# Patient Record
Sex: Female | Born: 1960 | Race: White | Hispanic: No | Marital: Married | Smoking: Former smoker
Health system: Southern US, Community
[De-identification: ages and names within clinical notes are randomized; demographics above are authoritative.]

## PROBLEM LIST (undated history)

## (undated) DIAGNOSIS — N393 Stress incontinence (female) (male): Secondary | ICD-10-CM

## (undated) DIAGNOSIS — G709 Myoneural disorder, unspecified: Secondary | ICD-10-CM

## (undated) DIAGNOSIS — F419 Anxiety disorder, unspecified: Secondary | ICD-10-CM

## (undated) DIAGNOSIS — F32A Depression, unspecified: Secondary | ICD-10-CM

## (undated) DIAGNOSIS — Z973 Presence of spectacles and contact lenses: Secondary | ICD-10-CM

## (undated) DIAGNOSIS — F329 Major depressive disorder, single episode, unspecified: Secondary | ICD-10-CM

## (undated) DIAGNOSIS — N201 Calculus of ureter: Secondary | ICD-10-CM

## (undated) DIAGNOSIS — R51 Headache: Secondary | ICD-10-CM

## (undated) DIAGNOSIS — R519 Headache, unspecified: Secondary | ICD-10-CM

## (undated) DIAGNOSIS — Z87442 Personal history of urinary calculi: Secondary | ICD-10-CM

## (undated) HISTORY — PX: TONSILLECTOMY: SUR1361

## (undated) HISTORY — DX: Anxiety disorder, unspecified: F41.9

## (undated) HISTORY — DX: Depression, unspecified: F32.A

## (undated) HISTORY — DX: Major depressive disorder, single episode, unspecified: F32.9

---
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
## 1999-02-23 ENCOUNTER — Other Ambulatory Visit: Admission: RE | Admit: 1999-02-23 | Discharge: 1999-02-23 | Payer: Self-pay | Admitting: *Deleted

## 1999-08-15 HISTORY — PX: CYSTOSCOPY W/ URETERAL STENT PLACEMENT: SHX1429

## 2000-02-29 ENCOUNTER — Other Ambulatory Visit: Admission: RE | Admit: 2000-02-29 | Discharge: 2000-02-29 | Payer: Self-pay | Admitting: Obstetrics and Gynecology

## 2001-03-01 ENCOUNTER — Other Ambulatory Visit: Admission: RE | Admit: 2001-03-01 | Discharge: 2001-03-01 | Payer: Self-pay | Admitting: Obstetrics and Gynecology

## 2001-06-12 ENCOUNTER — Ambulatory Visit (HOSPITAL_COMMUNITY): Admission: RE | Admit: 2001-06-12 | Discharge: 2001-06-12 | Payer: Self-pay | Admitting: Family Medicine

## 2001-06-12 ENCOUNTER — Encounter: Payer: Self-pay | Admitting: Family Medicine

## 2001-10-04 ENCOUNTER — Ambulatory Visit (HOSPITAL_COMMUNITY): Admission: RE | Admit: 2001-10-04 | Discharge: 2001-10-04 | Payer: Self-pay | Admitting: Urology

## 2001-10-04 ENCOUNTER — Encounter: Payer: Self-pay | Admitting: Urology

## 2002-03-14 ENCOUNTER — Other Ambulatory Visit: Admission: RE | Admit: 2002-03-14 | Discharge: 2002-03-14 | Payer: Self-pay | Admitting: Obstetrics and Gynecology

## 2003-02-08 ENCOUNTER — Emergency Department (HOSPITAL_COMMUNITY): Admission: EM | Admit: 2003-02-08 | Discharge: 2003-02-08 | Payer: Self-pay | Admitting: Emergency Medicine

## 2003-03-12 ENCOUNTER — Ambulatory Visit (HOSPITAL_COMMUNITY): Admission: RE | Admit: 2003-03-12 | Discharge: 2003-03-12 | Payer: Self-pay | Admitting: Pulmonary Disease

## 2003-03-18 ENCOUNTER — Ambulatory Visit (HOSPITAL_COMMUNITY): Admission: RE | Admit: 2003-03-18 | Discharge: 2003-03-18 | Payer: Self-pay | Admitting: Pulmonary Disease

## 2003-03-23 ENCOUNTER — Other Ambulatory Visit: Admission: RE | Admit: 2003-03-23 | Discharge: 2003-03-23 | Payer: Self-pay | Admitting: Obstetrics and Gynecology

## 2007-01-31 ENCOUNTER — Ambulatory Visit (HOSPITAL_COMMUNITY): Admission: RE | Admit: 2007-01-31 | Discharge: 2007-01-31 | Payer: Self-pay | Admitting: Pulmonary Disease

## 2008-01-27 ENCOUNTER — Ambulatory Visit (HOSPITAL_COMMUNITY): Admission: RE | Admit: 2008-01-27 | Discharge: 2008-01-27 | Payer: Self-pay | Admitting: Pulmonary Disease

## 2008-02-13 ENCOUNTER — Ambulatory Visit (HOSPITAL_COMMUNITY): Admission: RE | Admit: 2008-02-13 | Discharge: 2008-02-13 | Payer: Self-pay | Admitting: Pulmonary Disease

## 2008-04-16 ENCOUNTER — Ambulatory Visit (HOSPITAL_COMMUNITY): Admission: RE | Admit: 2008-04-16 | Discharge: 2008-04-16 | Payer: Self-pay | Admitting: Pulmonary Disease

## 2008-10-18 ENCOUNTER — Ambulatory Visit (HOSPITAL_COMMUNITY): Admission: RE | Admit: 2008-10-18 | Discharge: 2008-10-18 | Payer: Self-pay | Admitting: Pulmonary Disease

## 2009-10-25 ENCOUNTER — Ambulatory Visit (HOSPITAL_COMMUNITY): Admission: RE | Admit: 2009-10-25 | Discharge: 2009-10-26 | Payer: Self-pay | Admitting: Orthopedic Surgery

## 2009-10-25 HISTORY — PX: PERCUTANEOUS PINNING FEMORAL NECK FRACTURE: SUR1014

## 2010-11-07 LAB — URINE MICROSCOPIC-ADD ON

## 2010-11-07 LAB — BASIC METABOLIC PANEL
BUN: 11 mg/dL (ref 6–23)
CO2: 23 mEq/L (ref 19–32)
Calcium: 9.2 mg/dL (ref 8.4–10.5)
Chloride: 103 mEq/L (ref 96–112)
Creatinine, Ser: 0.75 mg/dL (ref 0.4–1.2)
GFR calc Af Amer: >60 mL/min (ref >60)
GFR calc non Af Amer: >60 mL/min (ref >60)
Glucose, Bld: 173 mg/dL — ABNORMAL HIGH (ref 70–99)
Potassium: 4.3 mEq/L (ref 3.5–5.1)
Sodium: 135 mEq/L (ref 135–145)

## 2010-11-07 LAB — COMPREHENSIVE METABOLIC PANEL
ALT: 20 U/L (ref 0–35)
AST: 20 U/L (ref 0–37)
Albumin: 3.9 g/dL (ref 3.5–5.2)
Alkaline Phosphatase: 51 U/L (ref 39–117)
BUN: 13 mg/dL (ref 6–23)
CO2: 27 mEq/L (ref 19–32)
Calcium: 9.4 mg/dL (ref 8.4–10.5)
Chloride: 105 mEq/L (ref 96–112)
Creatinine, Ser: 0.76 mg/dL (ref 0.4–1.2)
GFR calc Af Amer: >60 mL/min (ref >60)
GFR calc non Af Amer: >60 mL/min (ref >60)
Glucose, Bld: 92 mg/dL (ref 70–99)
Potassium: 4 mEq/L (ref 3.5–5.1)
Sodium: 138 mEq/L (ref 135–145)
Total Bilirubin: 0.4 mg/dL (ref 0.3–1.2)
Total Protein: 7.2 g/dL (ref 6.0–8.3)

## 2010-11-07 LAB — URINALYSIS, ROUTINE W REFLEX MICROSCOPIC
Bilirubin Urine: NEGATIVE
Glucose, UA: NEGATIVE mg/dL
Ketones, ur: NEGATIVE mg/dL
Leukocytes, UA: NEGATIVE
Nitrite: NEGATIVE
Protein, ur: NEGATIVE mg/dL
Specific Gravity, Urine: 1.027 (ref 1.005–1.030)
Urobilinogen, UA: 0.2 mg/dL (ref 0.0–1.0)
pH: 5.5 (ref 5.0–8.0)

## 2010-11-07 LAB — CBC
HCT: 43.1 % (ref 36.0–46.0)
HCT: 43.9 % (ref 36.0–46.0)
Hemoglobin: 14.2 g/dL (ref 12.0–15.0)
Hemoglobin: 14.6 g/dL (ref 12.0–15.0)
MCHC: 33 g/dL (ref 30.0–36.0)
MCHC: 33.1 g/dL (ref 30.0–36.0)
MCV: 91.9 fL (ref 78.0–100.0)
MCV: 93.8 fL (ref 78.0–100.0)
Platelets: 303 10*3/uL (ref 150–400)
Platelets: 313 10*3/uL (ref 150–400)
RBC: 4.59 MIL/uL (ref 3.87–5.11)
RBC: 4.78 MIL/uL (ref 3.87–5.11)
RDW: 13.1 % (ref 11.5–15.5)
RDW: 13.5 % (ref 11.5–15.5)
WBC: 14.3 10*3/uL — ABNORMAL HIGH (ref 4.0–10.5)
WBC: 9.3 10*3/uL (ref 4.0–10.5)

## 2010-11-07 LAB — PROTIME-INR
INR: 0.97 (ref 0.00–1.49)
Prothrombin Time: 12.8 seconds (ref 11.6–15.2)

## 2010-11-07 LAB — APTT: aPTT: 28 seconds (ref 24–37)

## 2010-11-07 LAB — PREGNANCY, URINE: Preg Test, Ur: NEGATIVE

## 2010-12-30 NOTE — Procedures (Signed)
   NAME:  Katherine Dodson, Katherine Dodson                            ACCOUNT NO.:  [id number]   MEDICAL RECORD NO.:  [id number]                   PATIENT TYPE:  OUT   LOCATION:  RESP                                 FACILITY:  [facility]   PHYSICIAN:  Edward L. Juanetta Gosling, M.D.             DATE OF BIRTH:  [birth year]   DATE OF PROCEDURE:  DATE OF DISCHARGE:                              PULMONARY FUNCTION TEST   IMPRESSION:  Spirometry is normal.                                               Edward L. Juanetta Gosling, M.D.    [facility]MED[facility]  D:  03/18/2003  T:  03/18/2003  Job:  [id number]

## 2013-02-27 ENCOUNTER — Other Ambulatory Visit (HOSPITAL_COMMUNITY): Payer: Self-pay | Admitting: Pulmonary Disease

## 2013-02-27 DIAGNOSIS — M545 Low back pain, unspecified: Secondary | ICD-10-CM

## 2013-03-05 ENCOUNTER — Ambulatory Visit (HOSPITAL_COMMUNITY): Payer: BC Managed Care – PPO

## 2013-06-02 ENCOUNTER — Ambulatory Visit (INDEPENDENT_AMBULATORY_CARE_PROVIDER_SITE_OTHER): Payer: BC Managed Care – PPO | Admitting: Physician Assistant

## 2013-06-02 VITALS — BP 120/88 | HR 95 | Temp 98.0°F | Resp 16 | Ht 67.0 in | Wt 177.0 lb

## 2013-06-02 DIAGNOSIS — N390 Urinary tract infection, site not specified: Secondary | ICD-10-CM

## 2013-06-02 DIAGNOSIS — R300 Dysuria: Secondary | ICD-10-CM

## 2013-06-02 LAB — POCT URINALYSIS DIPSTICK
Glucose, UA: 100
Nitrite, UA: POSITIVE
Protein, UA: 100
Spec Grav, UA: 1.025
Urobilinogen, UA: 1
pH, UA: 6

## 2013-06-02 LAB — POCT UA - MICROSCOPIC ONLY
Casts, Ur, LPF, POC: NEGATIVE
Crystals, Ur, HPF, POC: NEGATIVE
Mucus, UA: NEGATIVE
Yeast, UA: NEGATIVE

## 2013-06-02 MED ORDER — NITROFURANTOIN MONOHYD MACRO 100 MG PO CAPS: 100.0000 mg | ORAL_CAPSULE | Freq: Two times a day (BID) | ORAL | Status: DC

## 2013-06-02 NOTE — Patient Instructions (Signed)
Begin taking the nitrofurantoin (Macrobid) twice daily as directed.  Be sure to finish the full course.  Continue drinking plenty of water.  Continue using Azo for the next day or so to help with symptoms.  I will let you know when I have culture results back and if we need to do anything differently based on that.  Please let us know if any symptoms are worsening or not improving   Urinary Tract Infection Urinary tract infections (UTIs) can develop anywhere along your urinary tract. Your urinary tract is your body's drainage system for removing wastes and extra water. Your urinary tract includes two kidneys, two ureters, a bladder, and a urethra. Your kidneys are a pair of bean-shaped organs. Each kidney is about the size of your fist. They are located below your ribs, one on each side of your spine. CAUSES Infections are caused by microbes, which are microscopic organisms, including fungi, viruses, and bacteria. These organisms are so small that they can only be seen through a microscope. Bacteria are the microbes that most commonly cause UTIs. SYMPTOMS  Symptoms of UTIs may vary by age and gender of the patient and by the location of the infection. Symptoms in young women typically include a frequent and intense urge to urinate and a painful, burning feeling in the bladder or urethra during urination. Older women and men are more likely to be tired, shaky, and weak and have muscle aches and abdominal pain. A fever may mean the infection is in your kidneys. Other symptoms of a kidney infection include pain in your back or sides below the ribs, nausea, and vomiting. DIAGNOSIS To diagnose a UTI, your caregiver will ask you about your symptoms. Your caregiver also will ask to provide a urine sample. The urine sample will be tested for bacteria and white blood cells. White blood cells are made by your body to help fight infection. TREATMENT  Typically, UTIs can be treated with medication. Because most UTIs  are caused by a bacterial infection, they usually can be treated with the use of antibiotics. The choice of antibiotic and length of treatment depend on your symptoms and the type of bacteria causing your infection. HOME CARE INSTRUCTIONS  If you were prescribed antibiotics, take them exactly as your caregiver instructs you. Finish the medication even if you feel better after you have only taken some of the medication.  Drink enough water and fluids to keep your urine clear or pale yellow.  Avoid caffeine, tea, and carbonated beverages. They tend to irritate your bladder.  Empty your bladder often. Avoid holding urine for long periods of time.  Empty your bladder before and after sexual intercourse.  After a bowel movement, women should cleanse from front to back. Use each tissue only once. SEEK MEDICAL CARE IF:   You have back pain.  You develop a fever.  Your symptoms do not begin to resolve within 3 days. SEEK IMMEDIATE MEDICAL CARE IF:   You have severe back pain or lower abdominal pain.  You develop chills.  You have nausea or vomiting.  You have continued burning or discomfort with urination. MAKE SURE YOU:   Understand these instructions.  Will watch your condition.  Will get help right away if you are not doing well or get worse. Document Released: 05/10/2005 Document Revised: 01/30/2012 Document Reviewed: 09/08/2011 [facility] Patient Information 2014 [place], [place].

## 2013-06-02 NOTE — Progress Notes (Signed)
  Subjective:    Patient ID: Katherine Dodson, female    DOB: [birth year], 52 y.o.   MRN: [id number]  HPI   Katherine Dodson is a very pleasant 52 yr old female here with concern for UTI.  States she took a home test which was positive.  Noticed symptoms about 5 nights ago.  Has been taking Azo with some relief.  Symptoms include frequency, urgency, dysuria.  No abd pain, NV, FC, back pain.  No vaginal symptoms.  Has had UTIs in the past, but it's been many years.     Review of Systems  Constitutional: Negative for fever and chills.  HENT: Negative.   Respiratory: Negative.   Cardiovascular: Negative.   Gastrointestinal: Negative.   Genitourinary: Positive for dysuria, urgency and frequency. Negative for hematuria, vaginal discharge and dyspareunia.  Musculoskeletal: Negative.   Skin: Negative.   Neurological: Negative.        Objective:   Physical Exam  Vitals reviewed. Constitutional: She is oriented to person, place, and time. She appears well-developed and well-nourished. No distress.  HENT:  Head: Normocephalic.  Eyes: Conjunctivae are normal. No scleral icterus.  Cardiovascular: Normal rate, regular rhythm and normal heart sounds.   Pulmonary/Chest: Effort normal and breath sounds normal. She has no wheezes. She has no rales.  Abdominal: Soft. There is tenderness in the suprapubic area. There is no rebound.  Neurological: She is alert and oriented to person, place, and time.  Skin: Skin is warm and dry.  Psychiatric: She has a normal mood and affect. Her behavior is normal.    Results for orders placed in visit on 06/02/13  POCT UA - MICROSCOPIC ONLY      Result Value Range   WBC, Ur, HPF, POC TNTC     RBC, urine, microscopic 4-10     Bacteria, U Microscopic 1+     Mucus, UA negative     Epithelial cells, urine per micros 0-2     Crystals, Ur, HPF, POC negative     Casts, Ur, LPF, POC negative     Yeast, UA negative    POCT URINALYSIS DIPSTICK      Result Value Range   Color,  UA orange     Clarity, UA cloudy     Glucose, UA 100     Bilirubin, UA small     Ketones, UA trace     Spec Grav, UA 1.025     Blood, UA moderate     pH, UA 6.0     Protein, UA 100     Urobilinogen, UA 1.0     Nitrite, UA positive     Leukocytes, UA small (1+)           Assessment & Plan:  UTI (urinary tract infection) - Plan: nitrofurantoin, macrocrystal-monohydrate, (MACROBID) 100 MG capsule, Urine culture  Dysuria - Plan: POCT UA - Microscopic Only, POCT urinalysis dipstick   Katherine Dodson is a very pleasant 52 yr old female here with UTI.  Will start macrobid and send a urine culture.  May continue Azo for symptom relief.  Continue pushing fluids.  Will adjust therapy if needed based on culture results.  Pt to call or RTC if worsening or not improving.

## 2013-06-05 ENCOUNTER — Other Ambulatory Visit: Payer: Self-pay | Admitting: Physician Assistant

## 2013-06-05 LAB — URINE CULTURE: Colony Count: >=100000

## 2013-06-05 MED ORDER — CIPROFLOXACIN HCL 250 MG PO TABS: 250.0000 mg | ORAL_TABLET | Freq: Two times a day (BID) | ORAL | Status: DC

## 2013-06-05 NOTE — Progress Notes (Signed)
Urine cx with intermediate sens to macrobid, will change to cipro

## 2013-12-22 ENCOUNTER — Ambulatory Visit (INDEPENDENT_AMBULATORY_CARE_PROVIDER_SITE_OTHER): Payer: BC Managed Care – PPO | Admitting: Physician Assistant

## 2013-12-22 VITALS — BP 122/90 | HR 85 | Temp 98.2°F | Resp 16 | Ht 66.5 in | Wt 185.8 lb

## 2013-12-22 DIAGNOSIS — N950 Postmenopausal bleeding: Secondary | ICD-10-CM

## 2013-12-22 DIAGNOSIS — R109 Unspecified abdominal pain: Secondary | ICD-10-CM

## 2013-12-22 DIAGNOSIS — R350 Frequency of micturition: Secondary | ICD-10-CM

## 2013-12-22 LAB — POCT UA - MICROSCOPIC ONLY
Casts, Ur, LPF, POC: NEGATIVE
Crystals, Ur, HPF, POC: NEGATIVE
Mucus, UA: NEGATIVE
Yeast, UA: NEGATIVE

## 2013-12-22 LAB — POCT URINALYSIS DIPSTICK
Bilirubin, UA: NEGATIVE
Glucose, UA: NEGATIVE
Ketones, UA: NEGATIVE
Leukocytes, UA: NEGATIVE
Nitrite, UA: NEGATIVE
Protein, UA: NEGATIVE
Spec Grav, UA: 1.01
Urobilinogen, UA: 0.2
pH, UA: 6.5

## 2013-12-22 NOTE — Progress Notes (Signed)
Subjective:    Patient ID: Katherine Dodson, female    DOB: 23-Aug-1960, 53 y.o.   MRN: [id number]  HPI   Katherine Dodson is a very pleasant 53 yr old female here concerned that she has seen blood when she wipes after urinating for the last 2 days.  She has not noted any visible blood in the toilet bowl.  She has had some lower abdominal cramping and lower back pain.  States this does not feel like a period.  Her last period was about 6 months ago, before that it had been 2 yrs since she had a period.  She follows with Dr. Billy Coastaavon.  She has noticed a tiny amount of blood on the pads that she wears for incontinence but "not like a period."  She has had UTIs in the past.  She denies dysuria currently.  Possibly some increased frequency, but hard to say.  No NV, FC.  She denies vaginal discharge, itching, irritation   Review of Systems  Constitutional: Negative for fever and chills.  Respiratory: Negative.   Cardiovascular: Negative.   Gastrointestinal: Positive for abdominal pain (lower, cramping). Negative for nausea and vomiting.  Genitourinary: Positive for frequency and vaginal bleeding (?hematuria). Negative for dysuria, urgency, hematuria and flank pain.  Musculoskeletal: Positive for back pain.  Skin: Negative.        Objective:   Physical Exam  Vitals reviewed. Constitutional: She is oriented to person, place, and time. She appears well-developed and well-nourished. No distress.  HENT:  Head: Normocephalic and atraumatic.  Eyes: Conjunctivae are normal. No scleral icterus.  Cardiovascular: Normal rate, regular rhythm and normal heart sounds.   Pulmonary/Chest: Effort normal and breath sounds normal. She has no wheezes. She has no rales.  Abdominal: Soft. Bowel sounds are normal. There is no tenderness.  Genitourinary: There is no rash, tenderness or lesion on the right labia. There is no rash, tenderness or lesion on the left labia. Cervix exhibits no motion tenderness. Right adnexum displays  no mass, no tenderness and no fullness. Left adnexum displays no mass, no tenderness and no fullness. There is bleeding (scant blood in the vaginal vault) around the vagina.  Neurological: She is alert and oriented to person, place, and time.  Skin: Skin is warm and dry.  Psychiatric: She has a normal mood and affect. Her behavior is normal.    Results for orders placed in visit on 12/22/13  POCT UA - MICROSCOPIC ONLY      Result Value Ref Range   WBC, Ur, HPF, POC 0-1     RBC, urine, microscopic 4-8     Bacteria, U Microscopic trace     Mucus, UA neg     Epithelial cells, urine per micros 3-5     Crystals, Ur, HPF, POC neg     Casts, Ur, LPF, POC neg     Yeast, UA neg    POCT URINALYSIS DIPSTICK      Result Value Ref Range   Color, UA yellow     Clarity, UA clear     Glucose, UA neg     Bilirubin, UA neg     Ketones, UA neg     Spec Grav, UA 1.010     Blood, UA moderate     pH, UA 6.5     Protein, UA neg     Urobilinogen, UA 0.2     Nitrite, UA neg     Leukocytes, UA Negative  Assessment & Plan:  Postmenopausal vaginal bleeding  Increased frequency of urination - Plan: POCT UA - Microscopic Only, POCT urinalysis dipstick, Urine culture  Abdominal cramping - Plan: POCT UA - Microscopic Only, POCT urinalysis dipstick, Urine culture   Katherine Dodson is a very pleasant 53 yr old female here with postmenopausal vaginal spotting.  Pt is already established with Dr. Billy Coastaavon and will schedule an appointment to see him.  She had some concern for UTI, but based on UA and symptoms, I am less concerned for this.  Will send urine cx to completely r/o infection.  Discussed with pt that postmenopausal bleeding needs further evaluation, which she understands as she has had episodes of this in the past.  She will schedule an appt.  RTC if symptoms worsening prior to GYN  Pt to call or RTC if worsening or not improving  Katherine Dodson MHS, PA-C [facility] 5/11/20152:22 PM

## 2013-12-22 NOTE — Patient Instructions (Signed)
The bleeding that you have noticed appears to be coming from the vagina rather than the bladder.  For this reason, I want you to schedule an appointment with Dr. Billy Coastaavon  I am sending your urine to the lab to be cultured - this will definitively rule out infection in your bladder  If any of your symptoms are worsening prior to seeing Dr. Billy Coastaavon, please let me know

## 2013-12-25 ENCOUNTER — Other Ambulatory Visit: Payer: Self-pay | Admitting: Physician Assistant

## 2013-12-25 LAB — URINE CULTURE: Colony Count: 80000

## 2013-12-25 MED ORDER — NITROFURANTOIN MONOHYD MACRO 100 MG PO CAPS: 100.0000 mg | ORAL_CAPSULE | Freq: Two times a day (BID) | ORAL | Status: DC

## 2013-12-25 NOTE — Progress Notes (Signed)
Urine cx with 80,000 cfu enterococcus sens to macrobid.  Will treat UTI, but still want pt to follow up with Dr. Billy Coastaavon for post-meno bleeding.  See lab note

## 2014-06-15 ENCOUNTER — Ambulatory Visit: Payer: BC Managed Care – PPO | Admitting: *Deleted

## 2014-07-17 ENCOUNTER — Ambulatory Visit: Payer: BC Managed Care – PPO | Admitting: *Deleted

## 2014-10-15 ENCOUNTER — Encounter (HOSPITAL_COMMUNITY): Payer: Self-pay | Admitting: *Deleted

## 2014-10-15 ENCOUNTER — Emergency Department (HOSPITAL_COMMUNITY): Payer: Commercial Managed Care - PPO

## 2014-10-15 ENCOUNTER — Emergency Department (HOSPITAL_COMMUNITY)
Admission: EM | Admit: 2014-10-15 | Discharge: 2014-10-15 | Disposition: A | Discharge status: Home / Self Care | Payer: Commercial Managed Care - PPO | Attending: Emergency Medicine | Admitting: Emergency Medicine

## 2014-10-15 DIAGNOSIS — F419 Anxiety disorder, unspecified: Secondary | ICD-10-CM | POA: Insufficient documentation

## 2014-10-15 DIAGNOSIS — Z79899 Other long term (current) drug therapy: Secondary | ICD-10-CM | POA: Insufficient documentation

## 2014-10-15 DIAGNOSIS — R109 Unspecified abdominal pain: Secondary | ICD-10-CM

## 2014-10-15 DIAGNOSIS — F329 Major depressive disorder, single episode, unspecified: Secondary | ICD-10-CM | POA: Insufficient documentation

## 2014-10-15 DIAGNOSIS — N200 Calculus of kidney: Secondary | ICD-10-CM | POA: Insufficient documentation

## 2014-10-15 LAB — URINE MICROSCOPIC-ADD ON

## 2014-10-15 LAB — URINALYSIS, ROUTINE W REFLEX MICROSCOPIC
Bilirubin Urine: NEGATIVE
Glucose, UA: NEGATIVE mg/dL
Ketones, ur: NEGATIVE mg/dL
Leukocytes, UA: NEGATIVE
Nitrite: NEGATIVE
Specific Gravity, Urine: >1.03 — ABNORMAL HIGH (ref 1.005–1.030)
Urobilinogen, UA: 0.2 mg/dL (ref 0.0–1.0)
pH: 6 (ref 5.0–8.0)

## 2014-10-15 MED ORDER — TAMSULOSIN HCL 0.4 MG PO CAPS: 0.4000 mg | ORAL_CAPSULE | Freq: Every day | ORAL | Status: DC

## 2014-10-15 MED ORDER — KETOROLAC TROMETHAMINE 60 MG/2ML IM SOLN
60.0000 mg | Freq: Once | INTRAMUSCULAR | Status: AC
  Administered 2014-10-15: 60 mg via INTRAMUSCULAR
  Filled 2014-10-15: qty 2

## 2014-10-15 MED ORDER — HYDROCODONE-ACETAMINOPHEN 5-325 MG PO TABS: 2.0000 | ORAL_TABLET | ORAL | Status: DC | PRN

## 2014-10-15 MED ORDER — ONDANSETRON HCL 4 MG PO TABS: 4.0000 mg | ORAL_TABLET | Freq: Four times a day (QID) | ORAL | Status: DC

## 2014-10-15 MED ORDER — IBUPROFEN 800 MG PO TABS: 800.0000 mg | ORAL_TABLET | Freq: Three times a day (TID) | ORAL | Status: DC

## 2014-10-15 NOTE — ED Notes (Signed)
Pain lt flank around to LLQ, nausea, no vomiting,  ? Hematuria.

## 2014-10-15 NOTE — ED Provider Notes (Signed)
CSN: [id number]     Arrival date & time 10/15/14  1916 History   First MD Initiated Contact with Patient 10/15/14 1926     Chief Complaint  Patient presents with  . Flank Pain   HPI Comments: 27 YOF with a history of nephrolithiasis presents with left lower quadrant pain. She states this afternoon she experienced some minor discomfort that quickly escalated into sever flank pain. She describes it as constant sharp pain that is briefly relieved with change of position. She denies headache, SOB, chest pain, N/V/D, or bloody/ painful urination. She reports questionable blood on her tampon. States this feels like previous kidney stones for which a stent had to be placed; approx. 15 years ago. She attempted to relieve the pain with Ibuprofen with only minor relief.      Past Medical History  Diagnosis Date  . Anxiety   . Depression   . Kidney stone    Past Surgical History  Procedure Laterality Date  . Fracture surgery      left hip  . Cesarean section    . Tonsillectomy     History reviewed. No pertinent family history. History  Substance Use Topics  . Smoking status: Never Smoker   . Smokeless tobacco: Not on file  . Alcohol Use: Yes   OB History    No data available     Review of Systems  All other systems reviewed and are negative.   Allergies  Review of patient's allergies indicates no known allergies.  Home Medications   Prior to Admission medications   Medication Sig Start Date End Date Taking? Authorizing Provider  Calcium Carbonate-Vitamin D (CALCIUM + D PO) Take by mouth.    Historical Provider, MD  DULoxetine (CYMBALTA) 20 MG capsule Take 20 mg by mouth daily.    Historical Provider, MD  Multiple Vitamin (MULTIVITAMIN) capsule Take 1 capsule by mouth daily.    Historical Provider, MD  nitrofurantoin, macrocrystal-monohydrate, (MACROBID) 100 MG capsule Take 1 capsule (100 mg total) by mouth 2 (two) times daily. 12/25/13   Eleanore Delia Chimes, PA-C  zolpidem (AMBIEN CR)  12.5 MG CR tablet Take 12.5 mg by mouth at bedtime as needed for sleep.    Historical Provider, MD   BP 121/65 mmHg  Pulse 64  Temp(Src) 98.1 F (36.7 C) (Oral)  Resp 18  Ht 5' 6.5" (1.689 m)  Wt 185 lb (83.915 kg)  BMI 29.42 kg/m2  SpO2 97% Physical Exam  Constitutional: She is oriented to person, place, and time. She appears well-developed and well-nourished.  HENT:  Head: Normocephalic and atraumatic.  Eyes: Pupils are equal, round, and reactive to light.  Neck: Normal range of motion. Neck supple. No JVD present. No tracheal deviation present. No thyromegaly present.  Cardiovascular: Normal rate, regular rhythm, normal heart sounds and intact distal pulses.  Exam reveals no gallop and no friction rub.   No murmur heard. Pulmonary/Chest: Effort normal and breath sounds normal. No stridor. No respiratory distress. She has no wheezes. She has no rales. She exhibits no tenderness.  Abdominal: Soft. She exhibits no distension and no mass. There is tenderness. There is no rebound and no guarding.  Musculoskeletal: Normal range of motion.  Lymphadenopathy:    She has no cervical adenopathy.  Neurological: She is alert and oriented to person, place, and time. Coordination normal.  Skin: Skin is warm and dry.  Psychiatric: She has a normal mood and affect. Her behavior is normal. Judgment and thought content normal.  Nursing  note and vitals reviewed.   ED Course  Procedures (including critical care time) Labs Review Labs Reviewed  URINALYSIS, ROUTINE W REFLEX MICROSCOPIC    Imaging Review No results found.   EKG Interpretation None      MDM   Final diagnoses:  Nephrolithiasis   CT scan confirms non obstructing left renal calculi with evidence of proximal left ureteral partially obstructing 3 mm stone as described. She was treated with Toradol with improvement in pain. Discharged home with a prescription for ibuprofen, hydrocodone, flomax, and zofran. She was instructed to  follow-up with her nephrologist tomorrow. Advised to monitor for worsening pain and return to ED if uncontrolled pain or vomiting present.     Kelle DartingJeffrey Todd Rosaline Ezekiel, PA-C 10/15/14 2337  Vida RollerBrian D Miller, MD 10/15/14 [phone]

## 2014-10-15 NOTE — ED Provider Notes (Signed)
The patient is a 54 year old female, distant history of kidney stones more than 1 decade ago who presents with left flank pain. On exam the patient has a soft minimally tender abdomen, does not appear to be in distress after receiving Toradol, on ultrasound exam the patient does have evidence of hydronephrosis on the left consistent with a kidney stone. Urinalysis pending, CT ordered as the patient did require urologic evaluation last time.  Emergency Focused Ultrasound Exam Limited retroperitoneal ultrasound of kidneys  Performed and interpreted by Dr. Hyacinth MeekerMiller Indication: flank pain Focused abdominal ultrasound with both kidneys imaged in transverse and longitudinal planes in real-time. Interpretation: Yes - left hydronephrosis visualized.   Images archived electronically   Medical screening examination/treatment/procedure(s) were conducted as a shared visit with non-physician practitioner(s) and myself.  I personally evaluated the patient during the encounter.  Clinical Impression:   Final diagnoses:  Nephrolithiasis          Vida RollerBrian D Tilia Faso, MD 10/15/14 2351

## 2014-10-15 NOTE — ED Notes (Signed)
Discharge instructions and prescriptions given and reviewed with patient.  Patient verbalized understanding of sedating effects of pain medication and to follow up with nephrologist.  Patient ambulatory; discharged home in good condition.

## 2014-10-21 ENCOUNTER — Other Ambulatory Visit: Payer: Self-pay | Admitting: Urology

## 2014-10-28 ENCOUNTER — Encounter (HOSPITAL_BASED_OUTPATIENT_CLINIC_OR_DEPARTMENT_OTHER): Payer: Self-pay | Admitting: *Deleted

## 2014-10-28 MED FILL — Oxycodone w/ Acetaminophen Tab 5-325 MG: ORAL | Qty: 6 | Status: AC

## 2014-10-28 NOTE — Progress Notes (Signed)
NPO AFTER MN WITH EXCEPTION CLEAR LIQUIDS UNTIL 0700 (NO CREAM/ MILK PRODUCTS).  ARRIVE AT 1145. NEEDS HG. WILL TAKE FLOMAX AND ZOLOFT AM DOS W/ SIPS OF WATER AND MAY TAKE HYDROCODONE/ ZOFRAN IF NEEDED.

## 2014-11-03 ENCOUNTER — Ambulatory Visit (HOSPITAL_BASED_OUTPATIENT_CLINIC_OR_DEPARTMENT_OTHER): Admission: RE | Admit: 2014-11-03 | Payer: Commercial Managed Care - PPO | Source: Ambulatory Visit | Admitting: Urology

## 2014-11-03 HISTORY — DX: Calculus of ureter: N20.1

## 2014-11-03 HISTORY — DX: Presence of spectacles and contact lenses: Z97.3

## 2014-11-03 HISTORY — DX: Stress incontinence (female) (male): N39.3

## 2014-11-03 HISTORY — DX: Personal history of urinary calculi: Z87.442

## 2014-11-03 SURGERY — CYSTOURETEROSCOPY, WITH RETROGRADE PYELOGRAM AND STENT INSERTION
Anesthesia: General | Laterality: Left

## 2015-01-21 ENCOUNTER — Other Ambulatory Visit (HOSPITAL_COMMUNITY): Payer: Self-pay | Admitting: Pulmonary Disease

## 2015-01-21 DIAGNOSIS — R109 Unspecified abdominal pain: Secondary | ICD-10-CM

## 2015-01-28 ENCOUNTER — Ambulatory Visit (HOSPITAL_COMMUNITY)
Admission: RE | Admit: 2015-01-28 | Discharge: 2015-01-28 | Disposition: A | Discharge status: Home / Self Care | Payer: Commercial Managed Care - PPO | Source: Ambulatory Visit | Attending: Pulmonary Disease | Admitting: Pulmonary Disease

## 2015-01-28 DIAGNOSIS — R109 Unspecified abdominal pain: Secondary | ICD-10-CM | POA: Insufficient documentation

## 2015-01-28 DIAGNOSIS — N200 Calculus of kidney: Secondary | ICD-10-CM | POA: Insufficient documentation

## 2015-02-03 ENCOUNTER — Encounter (INDEPENDENT_AMBULATORY_CARE_PROVIDER_SITE_OTHER): Payer: Self-pay | Admitting: *Deleted

## 2015-03-09 ENCOUNTER — Ambulatory Visit (INDEPENDENT_AMBULATORY_CARE_PROVIDER_SITE_OTHER): Payer: Commercial Managed Care - PPO | Admitting: Internal Medicine

## 2015-03-23 ENCOUNTER — Ambulatory Visit (INDEPENDENT_AMBULATORY_CARE_PROVIDER_SITE_OTHER): Payer: Commercial Managed Care - PPO | Admitting: Internal Medicine

## 2015-04-05 ENCOUNTER — Ambulatory Visit (INDEPENDENT_AMBULATORY_CARE_PROVIDER_SITE_OTHER): Payer: Commercial Managed Care - PPO | Admitting: Internal Medicine

## 2015-04-05 ENCOUNTER — Other Ambulatory Visit (INDEPENDENT_AMBULATORY_CARE_PROVIDER_SITE_OTHER): Payer: Self-pay | Admitting: *Deleted

## 2015-04-05 ENCOUNTER — Encounter (INDEPENDENT_AMBULATORY_CARE_PROVIDER_SITE_OTHER): Payer: Self-pay | Admitting: Internal Medicine

## 2015-04-05 ENCOUNTER — Telehealth (INDEPENDENT_AMBULATORY_CARE_PROVIDER_SITE_OTHER): Payer: Self-pay | Admitting: *Deleted

## 2015-04-05 VITALS — BP 104/60 | HR 72 | Temp 97.8°F | Ht 66.0 in | Wt 183.4 lb

## 2015-04-05 DIAGNOSIS — N200 Calculus of kidney: Secondary | ICD-10-CM | POA: Insufficient documentation

## 2015-04-05 DIAGNOSIS — Z1211 Encounter for screening for malignant neoplasm of colon: Secondary | ICD-10-CM

## 2015-04-05 NOTE — Telephone Encounter (Signed)
Patient needs trilyte 

## 2015-04-05 NOTE — Progress Notes (Signed)
   Subjective:    Patient ID: Katherine Dodson, female    DOB: [birth year], 54 y.o.   MRN: [id number]  HPI Referred to our office by Dr. Juanetta Gosling for screening colonoscopy.  She tells me she is doing well.  After her father died in Jan 06, 2023, she says she lost weight. She says she was under a lot of stress. She thinks she may have lost about 15 pounds but she is putting the weight back on. Appetite is good. She is gaining her weight back. No dysphagia. There is no abdominal pain. She usually has a BM 2-3 times a day No family hx of colon cancer  Married. Has two children in good health.   Review of Systems Past Medical History  Diagnosis Date  . Anxiety   . Depression   . Left ureteral calculus   . SUI (stress urinary incontinence, female)   . History of kidney stones   . Nephrolithiasis     left side  . Wears contact lenses     Past Surgical History  Procedure Laterality Date  . Cesarean section  1997  . Percutaneous pinning femoral neck fracture Left 10-25-2009  . Cystoscopy w/ ureteral stent placement  01/06/2000  . Nasal septum surgery  1980's  . Tonsillectomy  age 23    No Known Allergies  Current Outpatient Prescriptions on File Prior to Visit  Medication Sig Dispense Refill  . Calcium Carbonate-Vitamin D (CALCIUM + D PO) Take 2 tablets by mouth daily.     . Estradiol-Norethindrone Acet 0.5-0.1 MG per tablet Take 0.25 tablets by mouth every morning.     Marland Kitchen ibuprofen (ADVIL,MOTRIN) 800 MG tablet Take 1 tablet (800 mg total) by mouth 3 (three) times daily. 21 tablet 0  . Multiple Vitamin (MULTIVITAMIN) capsule Take 1 capsule by mouth daily.    . sertraline (ZOLOFT) 25 MG tablet Take 25 mg by mouth every evening.     . zolpidem (AMBIEN CR) 12.5 MG CR tablet Take 12.5 mg by mouth at bedtime as needed for sleep.    Marland Kitchen ondansetron (ZOFRAN) 4 MG tablet Take 1 tablet (4 mg total) by mouth every 6 (six) hours. 12 tablet 0   No current facility-administered medications on file prior to visit.          Objective:   Physical Exam Blood pressure 104/60, pulse 72, temperature 97.8 F (36.6 C), height  (1.676 m), weight 183 lb 6.4 oz (83.19 kg). Alert and oriented. Skin warm and dry. Oral mucosa is moist.   . Sclera anicteric, conjunctivae is pink. Thyroid not enlarged. No cervical lymphadenopathy. Lungs clear. Heart regular rate and rhythm.  Abdomen is soft. Bowel sounds are positive. No hepatomegaly. No abdominal masses felt. No tenderness.  No edema to lower extremities.          Assessment & Plan:  Screening colonoscopy. The risks and benefits such as perforation, bleeding, and infection were reviewed with the patient and is agreeable.

## 2015-04-05 NOTE — Patient Instructions (Signed)
Screening colonoscopy.The risks and benefits such as perforation, bleeding, and infection were reviewed with the patient and is agreeable. 

## 2015-04-06 MED ORDER — PEG 3350-KCL-NA BICARB-NACL 420 G PO SOLR: 4000.0000 mL | Freq: Once | ORAL | Status: DC

## 2015-05-07 ENCOUNTER — Encounter (HOSPITAL_COMMUNITY): Payer: Self-pay | Admitting: *Deleted

## 2015-05-07 ENCOUNTER — Ambulatory Visit (HOSPITAL_COMMUNITY)
Admission: RE | Admit: 2015-05-07 | Discharge: 2015-05-07 | Disposition: A | Discharge status: Home Health | Payer: Commercial Managed Care - PPO | Source: Ambulatory Visit | Attending: Internal Medicine | Admitting: Internal Medicine

## 2015-05-07 ENCOUNTER — Encounter (HOSPITAL_COMMUNITY)
Admission: RE | Disposition: A | Discharge status: Home Health | Payer: Self-pay | Source: Ambulatory Visit | Attending: Internal Medicine

## 2015-05-07 DIAGNOSIS — F419 Anxiety disorder, unspecified: Secondary | ICD-10-CM | POA: Insufficient documentation

## 2015-05-07 DIAGNOSIS — K649 Unspecified hemorrhoids: Secondary | ICD-10-CM | POA: Diagnosis not present

## 2015-05-07 DIAGNOSIS — K644 Residual hemorrhoidal skin tags: Secondary | ICD-10-CM | POA: Diagnosis not present

## 2015-05-07 DIAGNOSIS — F418 Other specified anxiety disorders: Secondary | ICD-10-CM | POA: Diagnosis not present

## 2015-05-07 DIAGNOSIS — K648 Other hemorrhoids: Secondary | ICD-10-CM | POA: Diagnosis not present

## 2015-05-07 DIAGNOSIS — Z87891 Personal history of nicotine dependence: Secondary | ICD-10-CM | POA: Diagnosis not present

## 2015-05-07 DIAGNOSIS — Z79899 Other long term (current) drug therapy: Secondary | ICD-10-CM | POA: Insufficient documentation

## 2015-05-07 DIAGNOSIS — K6389 Other specified diseases of intestine: Secondary | ICD-10-CM | POA: Diagnosis not present

## 2015-05-07 DIAGNOSIS — Z1211 Encounter for screening for malignant neoplasm of colon: Secondary | ICD-10-CM | POA: Diagnosis not present

## 2015-05-07 HISTORY — DX: Headache: R51

## 2015-05-07 HISTORY — PX: COLONOSCOPY: SHX5424

## 2015-05-07 HISTORY — DX: Headache, unspecified: R51.9

## 2015-05-07 SURGERY — COLONOSCOPY
Anesthesia: Moderate Sedation

## 2015-05-07 MED ORDER — MEPERIDINE HCL 50 MG/ML IJ SOLN
INTRAMUSCULAR | Status: AC
  Filled 2015-05-07: qty 1

## 2015-05-07 MED ORDER — MIDAZOLAM HCL 5 MG/5ML IJ SOLN
INTRAMUSCULAR | Status: DC | PRN
  Administered 2015-05-07: 2 mg via INTRAVENOUS
  Administered 2015-05-07: 3 mg via INTRAVENOUS
  Administered 2015-05-07: 2 mg via INTRAVENOUS
  Administered 2015-05-07: 1 mg via INTRAVENOUS
  Administered 2015-05-07: 2 mg via INTRAVENOUS

## 2015-05-07 MED ORDER — MEPERIDINE HCL 50 MG/ML IJ SOLN
INTRAMUSCULAR | Status: DC | PRN
  Administered 2015-05-07 (×3): 25 mg via INTRAVENOUS

## 2015-05-07 MED ORDER — MIDAZOLAM HCL 5 MG/5ML IJ SOLN
INTRAMUSCULAR | Status: AC
  Filled 2015-05-07: qty 10

## 2015-05-07 MED ORDER — STERILE WATER FOR IRRIGATION IR SOLN
Status: DC | PRN
  Administered 2015-05-07: 08:00:00

## 2015-05-07 MED ORDER — SODIUM CHLORIDE 0.9 % IV SOLN
INTRAVENOUS | Status: DC
  Administered 2015-05-07: 07:00:00 via INTRAVENOUS

## 2015-05-07 NOTE — Discharge Instructions (Signed)
Resume usual medications and diet. °No driving for 24 hours. °Next screening exam in 10 years. ° °Colonoscopy, Care After °Refer to this sheet in the next few weeks. These instructions provide you with information on caring for yourself after your procedure. Your health care provider may also give you more specific instructions. Your treatment has been planned according to current medical practices, but problems sometimes occur. Call your health care provider if you have any problems or questions after your procedure. °WHAT TO EXPECT AFTER THE PROCEDURE  °After your procedure, it is typical to have the following: °· A small amount of blood in your stool. °· Moderate amounts of gas and mild abdominal cramping or bloating. °HOME CARE INSTRUCTIONS °· Do not drive, operate machinery, or sign important documents for 24 hours. °· You may shower and resume your regular physical activities, but move at a slower pace for the first 24 hours. °· Take frequent rest periods for the first 24 hours. °· Walk around or put a warm pack on your abdomen to help reduce abdominal cramping and bloating. °· Drink enough fluids to keep your urine clear or pale yellow. °· You may resume your normal diet as instructed by your health care provider. Avoid heavy or fried foods that are hard to digest. °· Avoid drinking alcohol for 24 hours or as instructed by your health care provider. °· Only take over-the-counter or prescription medicines as directed by your health care provider. °· If a tissue sample (biopsy) was taken during your procedure: °¨ Do not take aspirin or blood thinners for 7 days, or as instructed by your health care provider. °¨ Do not drink alcohol for 7 days, or as instructed by your health care provider. °¨ Eat soft foods for the first 24 hours. °SEEK MEDICAL CARE IF: °You have persistent spotting of blood in your stool 2-3 days after the procedure. °SEEK IMMEDIATE MEDICAL CARE IF: °· You have more than a small spotting of  blood in your stool. °· You pass large blood clots in your stool. °· Your abdomen is swollen (distended). °· You have nausea or vomiting. °· You have a fever. °· You have increasing abdominal pain that is not relieved with medicine. ° °

## 2015-05-07 NOTE — H&P (Signed)
Katherine Dodson is an 54 y.o. female.   Chief Complaint: Patient is here for colonoscopy. HPI: She is 54 year old Caucasian female who is in for screening colonoscopy. She denies abdominal pain change in bowel habits or rectal bleeding. Family History is negative for CRC.  Past Medical History  Diagnosis Date  . Anxiety   . Depression   . Left ureteral calculus   . SUI (stress urinary incontinence, female)   . History of kidney stones   . Nephrolithiasis     left side  . Wears contact lenses   . Headache     Past Surgical History  Procedure Laterality Date  . Cesarean section  1997  . Percutaneous pinning femoral neck fracture Left 10-25-2009  . Cystoscopy w/ ureteral stent placement  2001  . Nasal septum surgery  1980's  . Tonsillectomy  age 46    History reviewed. No pertinent family history. Social History:  reports that she quit smoking about 32 years ago. Her smoking use included Cigarettes. She quit after 7 years of use. She has never used smokeless tobacco. She reports that she drinks about 4.2 oz of alcohol per week. She reports that she does not use illicit drugs.  Allergies: No Known Allergies  Medications Prior to Admission  Medication Sig Dispense Refill  . Calcium Carbonate-Vitamin D (CALCIUM + D PO) Take 2 tablets by mouth daily.     . diphenhydrAMINE (BENADRYL) 12.5 MG/5ML liquid Take by mouth 4 (four) times daily as needed.    . Estradiol-Norethindrone Acet 0.5-0.1 MG per tablet Take 0.25 tablets by mouth every morning.     Marland Kitchen ibuprofen (ADVIL,MOTRIN) 800 MG tablet Take 1 tablet (800 mg total) by mouth 3 (three) times daily. 21 tablet 0  . polyethylene glycol-electrolytes (NULYTELY/GOLYTELY) 420 G solution Take 4,000 mLs by mouth once. 4000 mL 0  . sertraline (ZOLOFT) 25 MG tablet Take 25 mg by mouth every evening.     . zolpidem (AMBIEN CR) 12.5 MG CR tablet Take 12.5 mg by mouth at bedtime as needed for sleep.    . Multiple Vitamin (MULTIVITAMIN) capsule Take 1  capsule by mouth daily.    . ondansetron (ZOFRAN) 4 MG tablet Take 1 tablet (4 mg total) by mouth every 6 (six) hours. 12 tablet 0    No results found for this or any previous visit (from the past 48 hour(s)). No results found.  ROS  Blood pressure 123/85, pulse 69, temperature 98.3 F (36.8 C), temperature source Oral, height 5' 6.5" (1.689 m), weight 175 lb (79.379 kg), SpO2 98 %. Physical Exam  Constitutional: She appears well-developed and well-nourished.  HENT:  Mouth/Throat: Oropharynx is clear and moist.  Eyes: Conjunctivae are normal. No scleral icterus.  Neck: No thyromegaly present.  Cardiovascular: Normal rate, regular rhythm and normal heart sounds.   No murmur heard. Respiratory: Effort normal and breath sounds normal.  GI: Soft. She exhibits no distension and no mass. There is no tenderness.  Musculoskeletal: She exhibits no edema.  Lymphadenopathy:    She has no cervical adenopathy.  Neurological: She is alert.  Skin: Skin is warm and dry.     Assessment/Plan Average risk screening colonoscopy.  REHMAN,NAJEEB U 05/07/2015, 7:41 AM

## 2015-05-07 NOTE — Op Note (Signed)
COLONOSCOPY PROCEDURE REPORT  PATIENT:  Katherine Dodson  MR#:  [id number] Birthdate:  1961/03/20, 54 y.o., female Endoscopist:  Dr. Malissa Hippo, MD Referred By:  Dr. Fredirick Maudlin, MD  Procedure Date: 05/07/2015  Procedure:   Colonoscopy  Indications:  Average risk screening colonoscopy.  Informed Consent:  The procedure and risks were reviewed with the patient and informed consent was obtained.  Medications:  Demerol 75 mg IV Versed 10 mg IV  Description of procedure:  After a digital rectal exam was performed, that colonoscope was advanced from the anus through the rectum and colon to the area of the cecum, ileocecal valve and appendiceal orifice. The cecum was deeply intubated. These structures were well-seen and photographed for the record. From the level of the cecum and ileocecal valve, the scope was slowly and cautiously withdrawn. The mucosal surfaces were carefully surveyed utilizing scope tip to flexion to facilitate fold flattening as needed. The scope was pulled down into the rectum where a thorough exam including retroflexion was performed.  Findings:   Prep excellent. Normal mucosa of cecum, ascending colon, hepatic flexure, transverse colon, splenic flexure, descending and sigmoid colon. Normal rectal mucosa. Small hemorrhoids below the dentate line along with thickened anoderm.   Therapeutic/Diagnostic Maneuvers Performed:  None  Complications:  none  EBL: None  Cecal Withdrawal Time:  8 minutes  Impression:  Normal colonoscopy except external hemorrhoids and focal thickening to anoderm.  Recommendations:  Standard instructions given. Next screening exam in 10 years.  Katherine Dodson,Katherine Dodson  05/07/2015 8:15 AM  CC: Dr. Fredirick Maudlin, MD & Dr. Bonnetta Barry ref. provider found

## 2015-05-11 ENCOUNTER — Encounter (HOSPITAL_COMMUNITY): Payer: Self-pay | Admitting: Internal Medicine

## 2016-08-04 IMAGING — US US ABDOMEN COMPLETE
1 series · 14 of 25 positions shown · non-contrast
Comparison: CT 10/15/2014 .

CLINICAL DATA: Abdominal pain.

EXAM:
ULTRASOUND ABDOMEN COMPLETE

[Series 1: us abdomen complete · 0.18mm/px · 14 of 136 slices shown]
[im 1/136]
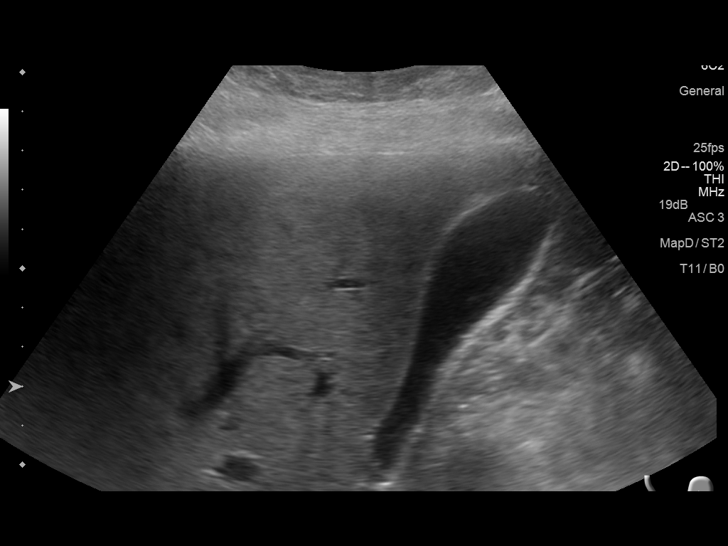
[im 12/136]
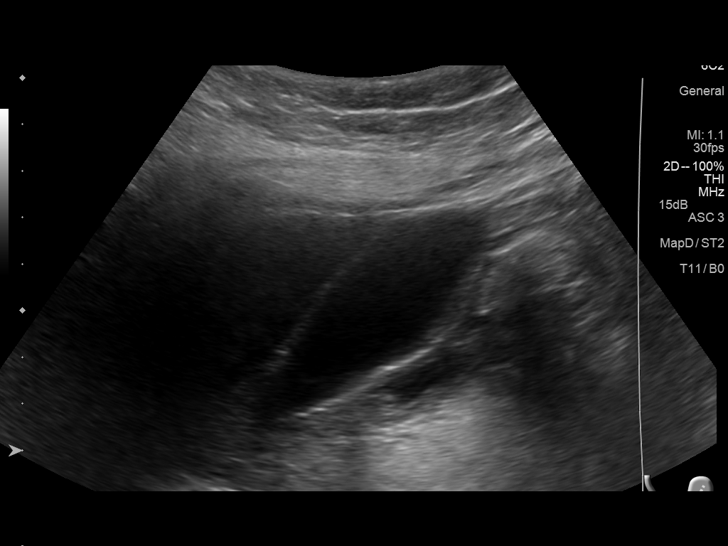
[im 23/136]
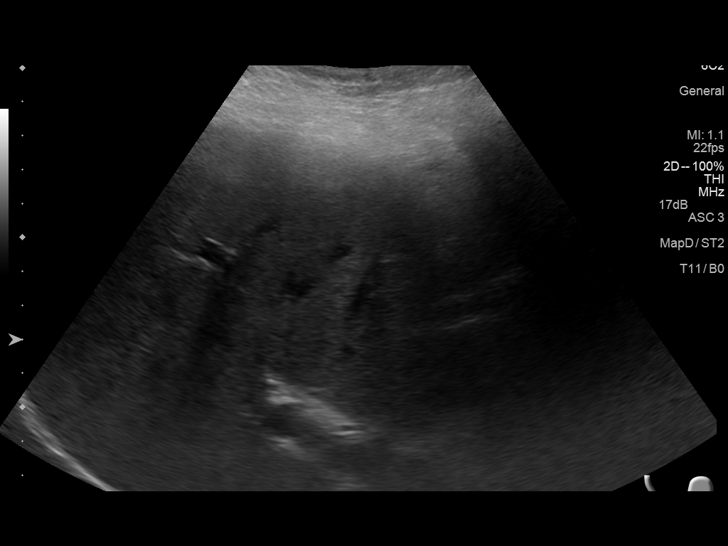
[im 34/136]
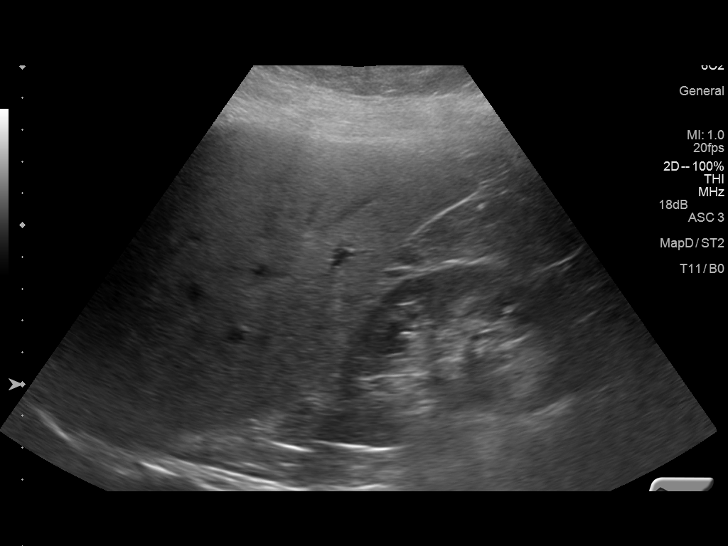
[im 46/136]
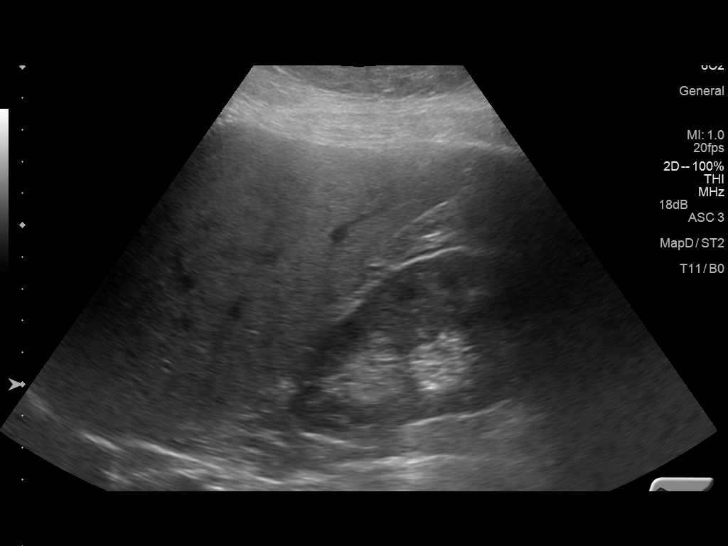
[im 51/136]
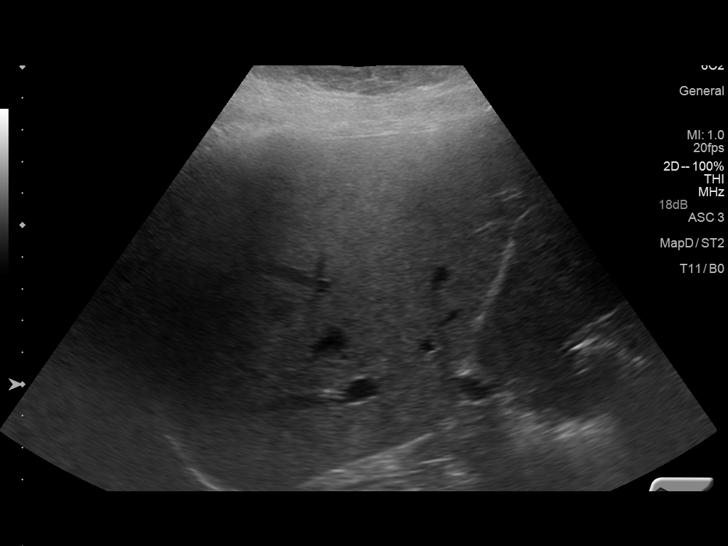
[im 62/136]
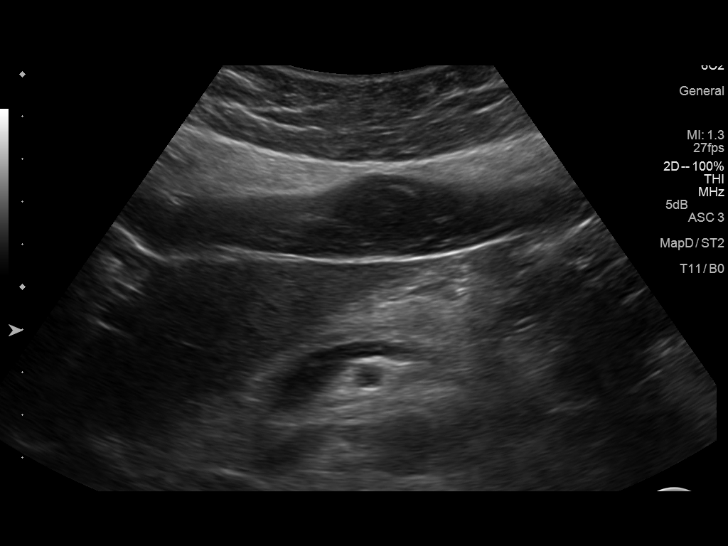
[im 74/136]
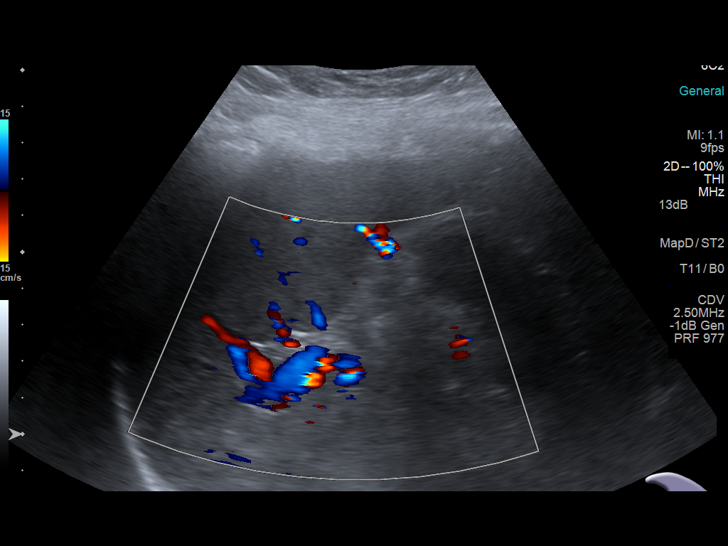
[im 85/136]
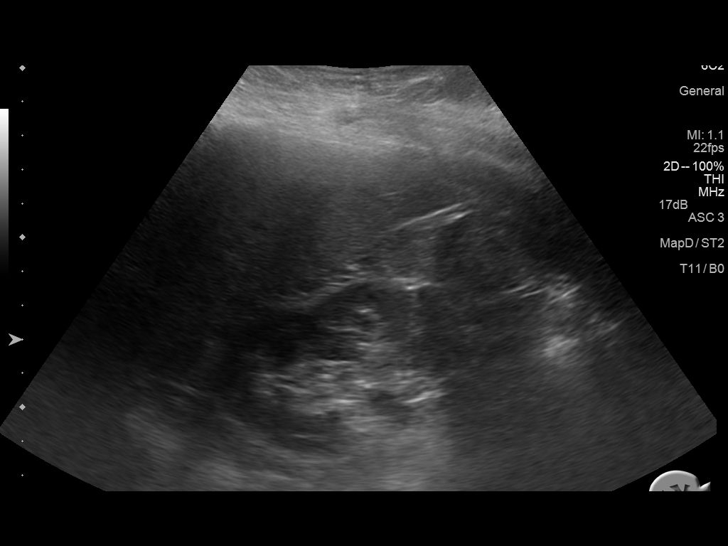
[im 91/136]
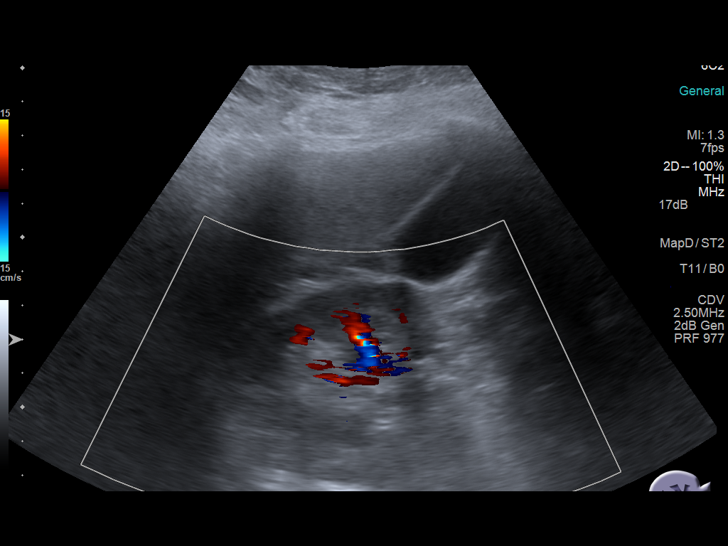
[im 102/136]
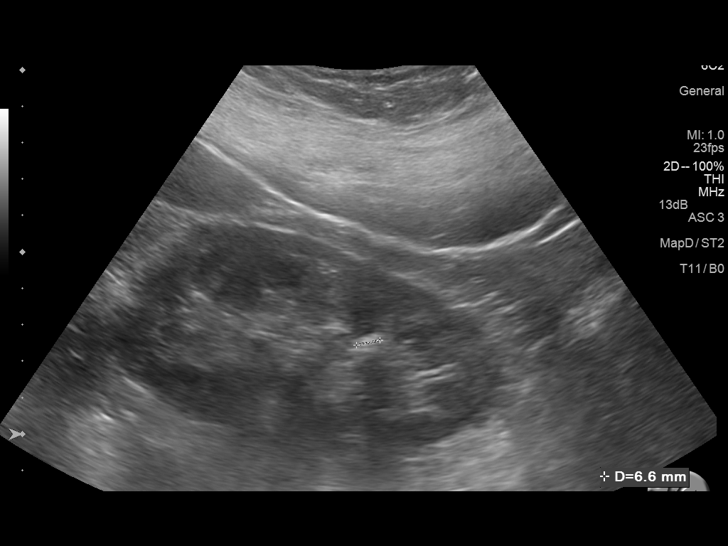
[im 113/136]
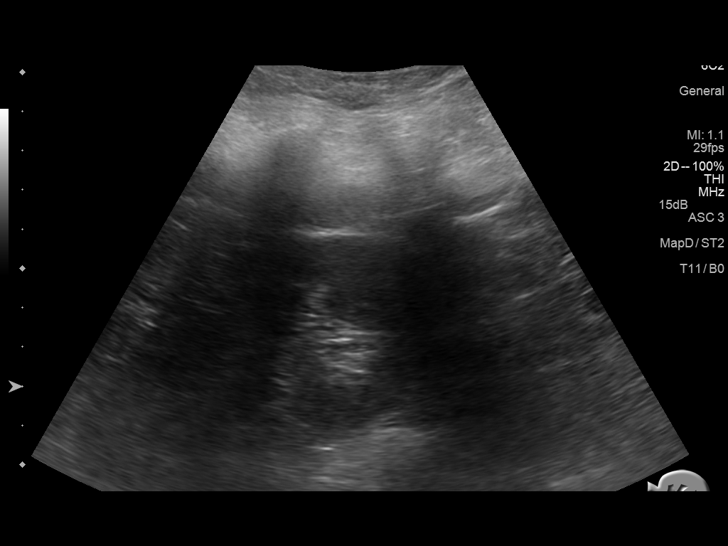
[im 124/136]
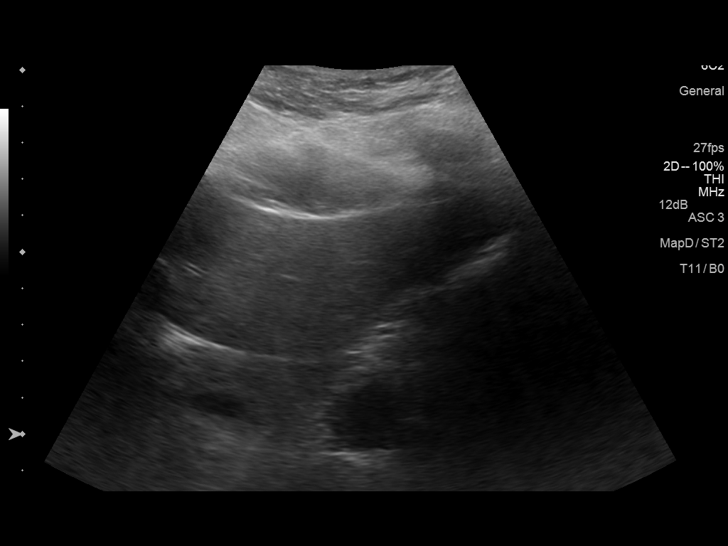
[im 136/136]
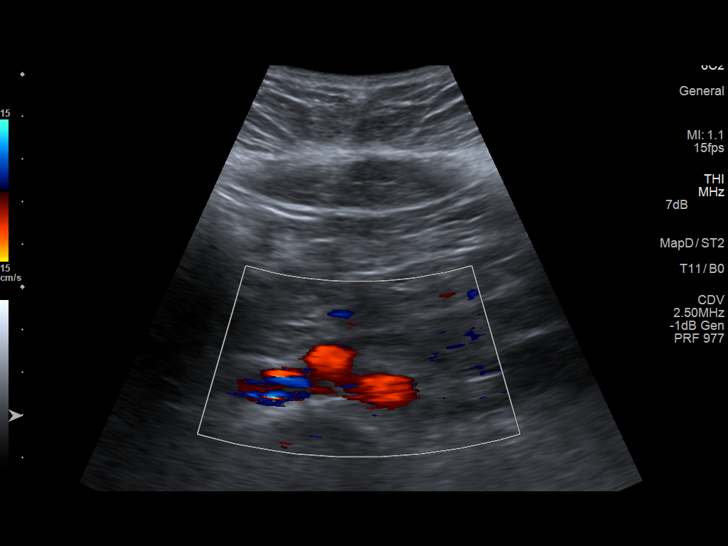

[14 of 25 positions shown; findings below may reference images not displayed]

FINDINGS: Gallbladder: No gallstones or wall thickening visualized. No
sonographic Murphy sign noted.

Common bile duct: Diameter: 4.0 mm

Liver: Liver is echogenic suggesting fatty infiltration. No focal
hepatic abnormality identified.

IVC: No abnormality visualized.

Pancreas: Visualized portion unremarkable.

Spleen: Size and appearance within normal limits.

Right Kidney: Length: 11.0 cm. Echogenicity within normal limits. No
mass or hydronephrosis visualized.

Left Kidney: Length: 11.5 cm. Echogenicity within normal limits. No
mass or hydronephrosis visualized. Nonobstructing 7 mm left renal
stone.

Abdominal aorta: No aneurysm visualized.

Other findings: None.
IMPRESSION: 1. Echogenic liver suggesting fatty infiltration and/or
hepatocellular disease.
2. 7 mm nonobstructing stone left kidney.

## 2016-11-15 ENCOUNTER — Other Ambulatory Visit: Payer: Self-pay | Admitting: Urology

## 2016-11-16 ENCOUNTER — Encounter (HOSPITAL_COMMUNITY): Payer: Self-pay

## 2016-11-20 ENCOUNTER — Encounter (HOSPITAL_COMMUNITY): Payer: Self-pay

## 2016-11-20 ENCOUNTER — Encounter (HOSPITAL_COMMUNITY)
Admission: RE | Admit: 2016-11-20 | Discharge: 2016-11-20 | Disposition: A | Discharge status: Home / Self Care | Payer: Commercial Managed Care - PPO | Source: Ambulatory Visit | Attending: Urology | Admitting: Urology

## 2016-11-20 DIAGNOSIS — N201 Calculus of ureter: Secondary | ICD-10-CM | POA: Diagnosis not present

## 2016-11-20 DIAGNOSIS — Z01812 Encounter for preprocedural laboratory examination: Secondary | ICD-10-CM | POA: Insufficient documentation

## 2016-11-20 HISTORY — DX: Myoneural disorder, unspecified: G70.9

## 2016-11-20 LAB — BASIC METABOLIC PANEL
Anion gap: 8 (ref 5–15)
BUN: 22 mg/dL — ABNORMAL HIGH (ref 6–20)
CO2: 24 mmol/L (ref 22–32)
Calcium: 9.4 mg/dL (ref 8.9–10.3)
Chloride: 110 mmol/L (ref 101–111)
Creatinine, Ser: 0.84 mg/dL (ref 0.44–1.00)
GFR calc Af Amer: >60 mL/min (ref >60)
GFR calc non Af Amer: >60 mL/min (ref >60)
Glucose, Bld: 109 mg/dL — ABNORMAL HIGH (ref 65–99)
Potassium: 4.4 mmol/L (ref 3.5–5.1)
Sodium: 142 mmol/L (ref 135–145)

## 2016-11-20 LAB — CBC
HCT: 37.3 % (ref 36.0–46.0)
Hemoglobin: 12.7 g/dL (ref 12.0–15.0)
MCH: 30 pg (ref 26.0–34.0)
MCHC: 34 g/dL (ref 30.0–36.0)
MCV: 88 fL (ref 78.0–100.0)
Platelets: 229 10*3/uL (ref 150–400)
RBC: 4.24 MIL/uL (ref 3.87–5.11)
RDW: 13.2 % (ref 11.5–15.5)
WBC: 7.8 10*3/uL (ref 4.0–10.5)

## 2016-11-20 NOTE — Patient Instructions (Signed)
Katherine Dodson  11/20/2016   Your procedure is scheduled on: 11/24/16  Report to [facility] Main  Entrance Take [place]  elevators to 3rd floor to  Short Stay Center at      1245 PM    Call this number if you have problems the morning of surgery [phone]   Remember: ONLY 1 PERSON MAY GO WITH YOU TO SHORT STAY TO GET  READY MORNING OF YOUR SURGERY.  Do not eat food:After Midnight.   YOU MAY HAVE CLEAR LIQUIDS UNTIL 0830 AM  THEN NOTHING BY MOUTH     CLEAR LIQUID DIET   Foods Allowed                                                                     Foods Excluded  Coffee and tea, regular and decaf                             liquids that you cannot  Plain Jell-O in any flavor                                             see through such as: Fruit ices (not with fruit pulp)                                     milk, soups, orange juice  Iced Popsicles                                    All solid food Carbonated beverages, regular and diet                                    Cranberry, grape and apple juices Sports drinks like Gatorade Lightly seasoned clear broth or consume(fat free) Sugar, honey syrup  Sample Menu Breakfast                                Lunch                                     Supper Cranberry juice                    Beef broth                            Chicken broth Jell-O                                     Grape juice  Apple juice Coffee or tea                        Jell-O                                      Popsicle                                                Coffee or tea                        Coffee or tea  _____________________________________________________________________     Take these medicines the morning of surgery with A SIP OF WATER: Hydrocodone if needed                                You may not have any metal on your body including hair pins and              piercings   Do not wear jewelry, make-up, lotions, powders or perfumes, deodorant             Do not wear nail polish.  Do not shave  48 hours prior to surgery.              Do not bring valuables to the hospital. [facility] IS NOT             RESPONSIBLE   FOR VALUABLES.  Contacts, dentures or bridgework may not be worn into surgery.      Patients discharged the day of surgery will not be allowed to drive home.  Name and phone number of your driver:  Special Instructions: N/A              Please read over the following fact sheets you were given: _____________________________________________________________________             [facility] - Preparing for Surgery Before surgery, you can play an important role.  Because skin is not sterile, your skin needs to be as free of germs as possible.  You can reduce the number of germs on your skin by washing with CHG (chlorahexidine gluconate) soap before surgery.  CHG is an antiseptic cleaner which kills germs and bonds with the skin to continue killing germs even after washing. Please DO NOT use if you have an allergy to CHG or antibacterial soaps.  If your skin becomes reddened/irritated stop using the CHG and inform your nurse when you arrive at Short Stay. Do not shave (including legs and underarms) for at least 48 hours prior to the first CHG shower.  You may shave your face/neck. Please follow these instructions carefully:  1.  Shower with CHG Soap the night before surgery and the  morning of Surgery.  2.  If you choose to wash your hair, wash your hair first as usual with your  normal  shampoo.  3.  After you shampoo, rinse your hair and body thoroughly to remove the  shampoo.                           4.  Use CHG  as you would any other liquid soap.  You can apply chg directly  to the skin and wash                       Gently with a scrungie or clean washcloth.  5.  Apply the CHG Soap to your body ONLY FROM THE NECK DOWN.   Do not use on face/ open                            Wound or open sores. Avoid contact with eyes, ears mouth and genitals (private parts).                       Wash face,  Genitals (private parts) with your normal soap.             6.  Wash thoroughly, paying special attention to the area where your surgery  will be performed.  7.  Thoroughly rinse your body with warm water from the neck down.  8.  DO NOT shower/wash with your normal soap after using and rinsing off  the CHG Soap.                9.  Pat yourself dry with a clean towel.            10.  Wear clean pajamas.            11.  Place clean sheets on your bed the night of your first shower and do not  sleep with pets. Day of Surgery : Do not apply any lotions/deodorants the morning of surgery.  Please wear clean clothes to the hospital/surgery center.  FAILURE TO FOLLOW THESE INSTRUCTIONS MAY RESULT IN THE CANCELLATION OF YOUR SURGERY PATIENT SIGNATURE_________________________________  NURSE SIGNATURE__________________________________  ________________________________________________________________________

## 2016-11-22 ENCOUNTER — Emergency Department (HOSPITAL_COMMUNITY)
Admission: EM | Admit: 2016-11-22 | Discharge: 2016-11-22 | Disposition: A | Discharge status: Home / Self Care | Payer: Commercial Managed Care - PPO | Attending: Emergency Medicine | Admitting: Emergency Medicine

## 2016-11-22 ENCOUNTER — Encounter (HOSPITAL_COMMUNITY): Payer: Self-pay

## 2016-11-22 ENCOUNTER — Emergency Department (HOSPITAL_COMMUNITY): Payer: Commercial Managed Care - PPO

## 2016-11-22 DIAGNOSIS — N201 Calculus of ureter: Secondary | ICD-10-CM | POA: Insufficient documentation

## 2016-11-22 DIAGNOSIS — Z87891 Personal history of nicotine dependence: Secondary | ICD-10-CM | POA: Insufficient documentation

## 2016-11-22 DIAGNOSIS — N23 Unspecified renal colic: Secondary | ICD-10-CM

## 2016-11-22 DIAGNOSIS — R1032 Left lower quadrant pain: Secondary | ICD-10-CM | POA: Diagnosis present

## 2016-11-22 LAB — URINALYSIS, ROUTINE W REFLEX MICROSCOPIC
Bacteria, UA: NONE SEEN
Bilirubin Urine: NEGATIVE
Glucose, UA: NEGATIVE mg/dL
Ketones, ur: NEGATIVE mg/dL
Leukocytes, UA: NEGATIVE
Nitrite: NEGATIVE
Protein, ur: NEGATIVE mg/dL
Specific Gravity, Urine: 1.016 (ref 1.005–1.030)
pH: 5 (ref 5.0–8.0)

## 2016-11-22 LAB — CBC WITH DIFFERENTIAL/PLATELET
Basophils Absolute: 0 10*3/uL (ref 0.0–0.1)
Basophils Relative: 1 %
Eosinophils Absolute: 0.2 10*3/uL (ref 0.0–0.7)
Eosinophils Relative: 2 %
HCT: 38.3 % (ref 36.0–46.0)
Hemoglobin: 13.3 g/dL (ref 12.0–15.0)
Lymphocytes Relative: 27 %
Lymphs Abs: 2.1 10*3/uL (ref 0.7–4.0)
MCH: 30.3 pg (ref 26.0–34.0)
MCHC: 34.7 g/dL (ref 30.0–36.0)
MCV: 87.2 fL (ref 78.0–100.0)
Monocytes Absolute: 0.6 10*3/uL (ref 0.1–1.0)
Monocytes Relative: 7 %
Neutro Abs: 5 10*3/uL (ref 1.7–7.7)
Neutrophils Relative %: 63 %
Platelets: 253 10*3/uL (ref 150–400)
RBC: 4.39 MIL/uL (ref 3.87–5.11)
RDW: 12.9 % (ref 11.5–15.5)
WBC: 7.8 10*3/uL (ref 4.0–10.5)

## 2016-11-22 LAB — BASIC METABOLIC PANEL
Anion gap: 8 (ref 5–15)
BUN: 21 mg/dL — ABNORMAL HIGH (ref 6–20)
CO2: 26 mmol/L (ref 22–32)
Calcium: 9.4 mg/dL (ref 8.9–10.3)
Chloride: 104 mmol/L (ref 101–111)
Creatinine, Ser: 0.76 mg/dL (ref 0.44–1.00)
GFR calc Af Amer: >60 mL/min (ref >60)
GFR calc non Af Amer: >60 mL/min (ref >60)
Glucose, Bld: 98 mg/dL (ref 65–99)
Potassium: 4 mmol/L (ref 3.5–5.1)
Sodium: 138 mmol/L (ref 135–145)

## 2016-11-22 MED ORDER — KETOROLAC TROMETHAMINE 60 MG/2ML IM SOLN
30.0000 mg | Freq: Once | INTRAMUSCULAR | Status: AC
  Administered 2016-11-22: 30 mg via INTRAMUSCULAR
  Filled 2016-11-22: qty 2

## 2016-11-22 MED ORDER — MORPHINE SULFATE (PF) 4 MG/ML IV SOLN
8.0000 mg | Freq: Once | INTRAVENOUS | Status: AC
  Administered 2016-11-22: 8 mg via INTRAMUSCULAR
  Filled 2016-11-22: qty 2

## 2016-11-22 MED ORDER — HYDROMORPHONE HCL 1 MG/ML IJ SOLN
1.0000 mg | Freq: Once | INTRAMUSCULAR | Status: AC
  Administered 2016-11-22: 1 mg via INTRAMUSCULAR
  Filled 2016-11-22: qty 1

## 2016-11-22 MED ORDER — OXYCODONE-ACETAMINOPHEN 5-325 MG PO TABS: ORAL_TABLET | ORAL | 0 refills | Status: DC

## 2016-11-22 NOTE — ED Provider Notes (Signed)
[facility]-EMERGENCY DEPT Provider Note   CSN: [id number] Arrival date & time: 11/22/16  1640     History   Chief Complaint Chief Complaint  Patient presents with  . Groin Pain    HPI Katherine Dodson is a 56 y.o. female.  HPI  Pt was seen at 1720. Per pt, c/o sudden onset and worsening of persistent left sided groin "pain" that began 4 weeks ago. Pt describes the pain as "like kidney stone." Has been associated with no other symptoms. Pt states she has been evaluated by Uro Dr. Annabell Howells and is scheduled "to get the kidney stone taken out" by Dr. Berneice Heinrich on Friday (in 2 days).  Pt states she has been taking her pain meds as prescribed without any improvement of her pain. Denies flank/back pain, no dysuria/hematuria, no abd pain, no N/V, no diarrhea, no CP/SOB, no fevers, no rash.    Past Medical History:  Diagnosis Date  . Anxiety   . Depression   . Headache    hx  . History of kidney stones   . Left ureteral calculus   . Neuromuscular disorder (HCC)    neuropathy feet  . SUI (stress urinary incontinence, female)   . Wears contact lenses     Patient Active Problem List   Diagnosis Date Noted  . Kidney stone 04/05/2015    Past Surgical History:  Procedure Laterality Date  . CESAREAN SECTION  1997  . COLONOSCOPY N/A 05/07/2015   Procedure: COLONOSCOPY;  Surgeon: Malissa Hippo, MD;  Location: [facility] SUITE;  Service: Endoscopy;  Laterality: N/A;  730  . CYSTOSCOPY W/ URETERAL STENT PLACEMENT  2001  . NASAL SEPTUM SURGERY  1980's  . PERCUTANEOUS PINNING FEMORAL NECK FRACTURE Left 10-25-2009  . TONSILLECTOMY  age 56    OB History    No data available       Home Medications    Prior to Admission medications   Medication Sig Start Date End Date Taking? Authorizing Provider  alendronate (FOSAMAX) 70 MG tablet Take 70 mg by mouth every Sunday. Take with a full glass of water on an empty stomach.   Yes Historical Provider, MD  Calcium Carb-Cholecalciferol (CALCIUM 600 + D PO)  Take 2 tablets by mouth daily with supper.   Yes Historical Provider, MD  diphenhydrAMINE (BENADRYL) 25 mg capsule Take 25-50 mg by mouth at bedtime.    Yes Historical Provider, MD  HYDROcodone-acetaminophen (NORCO) 10-325 MG tablet Take 1 tablet by mouth every 6 (six) hours as needed for moderate pain or severe pain.    Yes Historical Provider, MD  ibuprofen (ADVIL,MOTRIN) 800 MG tablet Take 1 tablet (800 mg total) by mouth 3 (three) times daily. Patient taking differently: Take 800-1,600 mg by mouth 2 (two) times daily as needed for mild pain or moderate pain (for pain.).  10/15/14  Yes Eyvonne Mechanic, PA-C  Multiple Vitamin (MULTIVITAMIN WITH MINERALS) TABS tablet Take 1 tablet by mouth daily with supper.   Yes Historical Provider, MD  sertraline (ZOLOFT) 50 MG tablet Take 50 mg by mouth at bedtime.   Yes Historical Provider, MD  silodosin (RAPAFLO) 8 MG CAPS capsule Take 8 mg by mouth daily as needed (for kidney stones.).   Yes Historical Provider, MD  zolpidem (AMBIEN CR) 12.5 MG CR tablet Take 12.5 mg by mouth at bedtime.    Yes Historical Provider, MD    Family History No family history on file.  Social History Social History  Substance Use Topics  . Smoking  status: Former Smoker    Years: 7.00    Types: Cigarettes    Quit date: 10/28/1982  . Smokeless tobacco: Never Used  . Alcohol use 4.2 oz/week    7 Glasses of wine per week     Comment: ONE WINE DAILY or a beer     Allergies   Patient has no known allergies.   Review of Systems Review of Systems ROS: Statement: All systems negative except as marked or noted in the HPI; Constitutional: Negative for fever and chills. ; ; Eyes: Negative for eye pain, redness and discharge. ; ; ENMT: Negative for ear pain, hoarseness, nasal congestion, sinus pressure and sore throat. ; ; Cardiovascular: Negative for chest pain, palpitations, diaphoresis, dyspnea and peripheral edema. ; ; Respiratory: Negative for cough, wheezing and stridor. ; ;  Gastrointestinal: Negative for nausea, vomiting, diarrhea, abdominal pain, blood in stool, hematemesis, jaundice and rectal bleeding. . ; ; Genitourinary: +left groin pain. Negative for dysuria, flank pain and hematuria. ; ; Musculoskeletal: Negative for back pain and neck pain. Negative for swelling and trauma.; ; Skin: Negative for pruritus, rash, abrasions, blisters, bruising and skin lesion.; ; Neuro: Negative for headache, lightheadedness and neck stiffness. Negative for weakness, altered level of consciousness, altered mental status, extremity weakness, paresthesias, involuntary movement, seizure and syncope.       Physical Exam Updated Vital Signs BP (!) 155/89 (BP Location: Right Arm)   Pulse 84   Temp 97.9 F (36.6 C) (Oral)   Resp 18   Ht  (1.676 m)   Wt 190 lb (86.2 kg)   SpO2 96%   BMI 30.67 kg/m   Physical Exam 1725: Physical examination:  Nursing notes reviewed; Vital signs and O2 SAT reviewed;  Constitutional: Well developed, Well nourished, Well hydrated, In no acute distress; Head:  Normocephalic, atraumatic; Eyes: EOMI, PERRL, No scleral icterus; ENMT: Mouth and pharynx normal, Mucous membranes moist; Neck: Supple, Full range of motion, No lymphadenopathy; Cardiovascular: Regular rate and rhythm, No gallop; Respiratory: Breath sounds clear & equal bilaterally, No wheezes.  Speaking full sentences with ease, Normal respiratory effort/excursion; Chest: Nontender, Movement normal; Abdomen: Soft, Nontender, Nondistended, Normal bowel sounds; Genitourinary: No CVA tenderness; Extremities: Pulses normal, No tenderness, No edema, No calf edema or asymmetry.; Neuro: AA&Ox3, Major CN grossly intact.  Speech clear. No gross focal motor or sensory deficits in extremities.; Skin: Color normal, Warm, Dry.   ED Treatments / Results  Labs (all labs ordered are listed, but only abnormal results are displayed)   EKG  EKG Interpretation None        Radiology   Procedures Procedures (including critical care time)  Medications Ordered in ED Medications  morphine 4 MG/ML injection 8 mg (8 mg Intramuscular Given 11/22/16 1742)     Initial Impression / Assessment and Plan / ED Course  I have reviewed the triage vital signs and the nursing notes.  Pertinent labs & imaging results that were available during my care of the patient were reviewed by me and considered in my medical decision making (see chart for details).  MDM Reviewed: previous chart, nursing note and vitals Reviewed previous: labs Interpretation: labs and CT scan   Results for orders placed or performed during the hospital encounter of 11/22/16  Urinalysis, Routine w reflex microscopic  Result Value Ref Range   Color, Urine YELLOW YELLOW   APPearance CLEAR CLEAR   Specific Gravity, Urine 1.016 1.005 - 1.030   pH 5.0 5.0 - 8.0   Glucose, UA  NEGATIVE NEGATIVE mg/dL   Hgb urine dipstick LARGE (A) NEGATIVE   Bilirubin Urine NEGATIVE NEGATIVE   Ketones, ur NEGATIVE NEGATIVE mg/dL   Protein, ur NEGATIVE NEGATIVE mg/dL   Nitrite NEGATIVE NEGATIVE   Leukocytes, UA NEGATIVE NEGATIVE   RBC / HPF TOO NUMEROUS TO COUNT 0 - 5 RBC/hpf   WBC, UA 0-5 0 - 5 WBC/hpf   Bacteria, UA NONE SEEN NONE SEEN   Squamous Epithelial / LPF 0-5 (A) NONE SEEN   Mucous PRESENT   Basic metabolic panel  Result Value Ref Range   Sodium 138 135 - 145 mmol/L   Potassium 4.0 3.5 - 5.1 mmol/L   Chloride 104 101 - 111 mmol/L   CO2 26 22 - 32 mmol/L   Glucose, Bld 98 65 - 99 mg/dL   BUN 21 (H) 6 - 20 mg/dL   Creatinine, Ser 1.61 0.44 - 1.00 mg/dL   Calcium 9.4 8.9 - 09.6 mg/dL   GFR calc non Af Amer >60 >60 mL/min   GFR calc Af Amer >60 >60 mL/min   Anion gap 8 5 - 15  CBC with Differential  Result Value Ref Range   WBC 7.8 4.0 - 10.5 K/uL   RBC 4.39 3.87 - 5.11 MIL/uL   Hemoglobin 13.3 12.0 - 15.0 g/dL   HCT 04.5 40.9 - 81.1 %   MCV 87.2 78.0 - 100.0 fL   MCH 30.3 26.0 - 34.0  pg   MCHC 34.7 30.0 - 36.0 g/dL   RDW 91.4 78.2 - 95.6 %   Platelets 253 150 - 400 K/uL   Neutrophils Relative % 63 %   Neutro Abs 5.0 1.7 - 7.7 K/uL   Lymphocytes Relative 27 %   Lymphs Abs 2.1 0.7 - 4.0 K/uL   Monocytes Relative 7 %   Monocytes Absolute 0.6 0.1 - 1.0 K/uL   Eosinophils Relative 2 %   Eosinophils Absolute 0.2 0.0 - 0.7 K/uL   Basophils Relative 1 %   Basophils Absolute 0.0 0.0 - 0.1 K/uL   Ct Renal Stone Study Result Date: 11/22/2016 CLINICAL DATA:  Left groin/flank pain x4 weeks, history of kidney stone EXAM: CT ABDOMEN AND PELVIS WITHOUT CONTRAST TECHNIQUE: Multidetector CT imaging of the abdomen and pelvis was performed following the standard protocol without IV contrast. COMPARISON:  10/15/2014 FINDINGS: Lower chest: Mild dependent atelectasis the lung bases. Hepatobiliary: Unenhanced liver is unremarkable. Mild layering gallbladder sludge and/or noncalcified gallstones (series 2/ image 31). No associated inflammatory changes. Pancreas: Within normal limits. Spleen: Within normal limits. Adrenals/Urinary Tract: Adrenal glands are within normal limits. 7 mm nonobstructing left upper pole renal calculus (series 2/ image 21). Additional 7 mm nonobstructing left lower pole renal calculus (series 2/ image 32). Right kidney is within normal limits. 7 mm distal left ureteral calculus at the UVJ (coronal image 55). No hydronephrosis. Bladder is within normal limits. Stomach/Bowel: Stomach is within normal limits. No evidence of bowel obstruction. Normal appendix (series 2/ image 62). Vascular/Lymphatic: No evidence of abdominal aortic aneurysm. No suspicious abdominopelvic lymphadenopathy. Reproductive: Uterus is within normal limits. Bilateral ovaries are within normal limits. Other: No abdominopelvic ascites. Musculoskeletal: Mild degenerative changes of the mid lumbar spine. IMPRESSION: 7 mm distal left ureteral calculus at the UVJ.  No hydronephrosis. Two additional nonobstructing  left renal calculi measuring up to 7 mm. Mild layering gallbladder sludge and/or noncalcified gallstones, without associated inflammatory changes. Electronically Signed   By: Charline Bills M.D.   On: 11/22/2016 18:34    1915:  Pt states her pain has improved somewhat. Appears comfortable. Will dose IM dilaudid. T/C to Uro Dr. Retta Diones, case discussed, including:  HPI, pertinent PM/SHx, VS/PE, dx testing, ED course and treatment:  He has viewed the office notes and CT images, states OK to dose IM toradol to attempt to d/c pt; if are able to d/c patient, have her call Uro ofc if has further/worsening symptoms, she will need to go to [facility] for admission (is on surgery schedule there on Friday and there is no Uro surgery scheduled at [facility] for the next 2 days).    2005:  Pt now feels improved after IM morphine, IM dilaudid, IM toradol. States she is ready to go home now. Dx and testing, as well as d/w Uro MD, d/w pt and family.  Questions answered.  Verb understanding, agreeable to d/c home with outpt f/u.   Final Clinical Impressions(s) / ED Diagnoses   Final diagnoses:  None    New Prescriptions New Prescriptions   No medications on file     Samuel Jester, DO 11/29/16 1012

## 2016-11-22 NOTE — ED Notes (Addendum)
Pt reports she has taken #6 tablets of Ibuprofen , #2 tablets of Hydrocodone 10/325mg  and #1 tablet of Flomax today without relief of pain.   Pt denies n/v/d, fever. Pt reports severe left groin pain due to left kidney stone that is to be removed with possible stent placement on Friday. Pt denies hematuria and burning with urination. Pt does report it's uncomfortable to urinate, but that it does not burn.

## 2016-11-22 NOTE — ED Triage Notes (Signed)
Pt reports that she has left sided groin pain for one month. She has been following up with [facility] and has been taken flomax. Scheduled for removal of stone Friday, but pain is severe

## 2016-11-22 NOTE — Discharge Instructions (Signed)
Take the new prescription for pain medication as directed. Continue to take your ibuprofen as previously directed. Call the Urologist tomorrow to confirm your previously scheduled appointment at [facility] on Friday. Return to the Emergency Department immediately if worsening.

## 2016-11-23 LAB — URINE CULTURE: Culture: NO GROWTH

## 2016-11-24 ENCOUNTER — Ambulatory Visit (HOSPITAL_COMMUNITY): Payer: Commercial Managed Care - PPO | Admitting: Anesthesiology

## 2016-11-24 ENCOUNTER — Ambulatory Visit (HOSPITAL_COMMUNITY)
Admission: RE | Admit: 2016-11-24 | Discharge: 2016-11-24 | Disposition: A | Discharge status: Home / Self Care | Payer: Commercial Managed Care - PPO | Source: Ambulatory Visit | Attending: Urology | Admitting: Urology

## 2016-11-24 ENCOUNTER — Encounter (HOSPITAL_COMMUNITY): Payer: Self-pay | Admitting: *Deleted

## 2016-11-24 ENCOUNTER — Ambulatory Visit (HOSPITAL_COMMUNITY): Payer: Commercial Managed Care - PPO

## 2016-11-24 ENCOUNTER — Encounter (HOSPITAL_COMMUNITY)
Admission: RE | Disposition: A | Discharge status: Home / Self Care | Payer: Self-pay | Source: Ambulatory Visit | Attending: Urology

## 2016-11-24 DIAGNOSIS — F329 Major depressive disorder, single episode, unspecified: Secondary | ICD-10-CM | POA: Diagnosis not present

## 2016-11-24 DIAGNOSIS — Z87891 Personal history of nicotine dependence: Secondary | ICD-10-CM | POA: Insufficient documentation

## 2016-11-24 DIAGNOSIS — N202 Calculus of kidney with calculus of ureter: Secondary | ICD-10-CM | POA: Diagnosis present

## 2016-11-24 DIAGNOSIS — F419 Anxiety disorder, unspecified: Secondary | ICD-10-CM | POA: Diagnosis not present

## 2016-11-24 DIAGNOSIS — Z79899 Other long term (current) drug therapy: Secondary | ICD-10-CM | POA: Diagnosis not present

## 2016-11-24 DIAGNOSIS — N132 Hydronephrosis with renal and ureteral calculous obstruction: Secondary | ICD-10-CM | POA: Insufficient documentation

## 2016-11-24 DIAGNOSIS — Z419 Encounter for procedure for purposes other than remedying health state, unspecified: Secondary | ICD-10-CM

## 2016-11-24 HISTORY — PX: CYSTOSCOPY WITH STENT PLACEMENT: SHX5790

## 2016-11-24 HISTORY — PX: HOLMIUM LASER APPLICATION: SHX5852

## 2016-11-24 HISTORY — PX: CYSTOSCOPY/RETROGRADE/URETEROSCOPY/STONE EXTRACTION WITH BASKET: SHX5317

## 2016-11-24 SURGERY — CYSTOSCOPY, WITH CALCULUS REMOVAL USING BASKET
Anesthesia: General | Site: Urethra | Laterality: Left

## 2016-11-24 MED ORDER — PROPOFOL 10 MG/ML IV BOLUS
INTRAVENOUS | Status: AC
  Filled 2016-11-24: qty 20

## 2016-11-24 MED ORDER — FENTANYL CITRATE (PF) 100 MCG/2ML IJ SOLN: 25.0000 ug | INTRAMUSCULAR | Status: DC | PRN

## 2016-11-24 MED ORDER — SENNOSIDES-DOCUSATE SODIUM 8.6-50 MG PO TABS: 1.0000 | ORAL_TABLET | Freq: Two times a day (BID) | ORAL | 0 refills | Status: AC

## 2016-11-24 MED ORDER — KETOROLAC TROMETHAMINE 10 MG PO TABS: 10.0000 mg | ORAL_TABLET | Freq: Four times a day (QID) | ORAL | 1 refills | Status: AC | PRN

## 2016-11-24 MED ORDER — LACTATED RINGERS IV SOLN
INTRAVENOUS | Status: DC
  Administered 2016-11-24 (×2): via INTRAVENOUS

## 2016-11-24 MED ORDER — EPHEDRINE SULFATE-NACL 50-0.9 MG/10ML-% IV SOSY
PREFILLED_SYRINGE | INTRAVENOUS | Status: DC | PRN
  Administered 2016-11-24: 15 mg via INTRAVENOUS

## 2016-11-24 MED ORDER — CEPHALEXIN 500 MG PO CAPS: 500.0000 mg | ORAL_CAPSULE | Freq: Two times a day (BID) | ORAL | 0 refills | Status: AC

## 2016-11-24 MED ORDER — KETOROLAC TROMETHAMINE 30 MG/ML IJ SOLN: INTRAMUSCULAR | Status: DC | PRN

## 2016-11-24 MED ORDER — SODIUM CHLORIDE 0.9 % IV SOLN
INTRAVENOUS | Status: DC | PRN
  Administered 2016-11-24: 17 mL

## 2016-11-24 MED ORDER — ONDANSETRON HCL 4 MG/2ML IJ SOLN
INTRAMUSCULAR | Status: AC
  Filled 2016-11-24: qty 2

## 2016-11-24 MED ORDER — FENTANYL CITRATE (PF) 100 MCG/2ML IJ SOLN
INTRAMUSCULAR | Status: DC | PRN
  Administered 2016-11-24 (×4): 50 ug via INTRAVENOUS

## 2016-11-24 MED ORDER — GLYCOPYRROLATE 0.2 MG/ML IV SOSY
PREFILLED_SYRINGE | INTRAVENOUS | Status: DC | PRN
  Administered 2016-11-24: .2 mg via INTRAVENOUS

## 2016-11-24 MED ORDER — MIDAZOLAM HCL 2 MG/2ML IJ SOLN
INTRAMUSCULAR | Status: AC
  Filled 2016-11-24: qty 2

## 2016-11-24 MED ORDER — KETOROLAC TROMETHAMINE 30 MG/ML IJ SOLN
30.0000 mg | Freq: Once | INTRAMUSCULAR | Status: AC
Start: 2016-11-24 — End: 2016-11-24
  Administered 2016-11-24: 30 mg via INTRAVENOUS
  Filled 2016-11-24: qty 1

## 2016-11-24 MED ORDER — LIDOCAINE 2% (20 MG/ML) 5 ML SYRINGE
INTRAMUSCULAR | Status: DC | PRN
  Administered 2016-11-24: 100 mg via INTRAVENOUS

## 2016-11-24 MED ORDER — ONDANSETRON HCL 4 MG/2ML IJ SOLN
INTRAMUSCULAR | Status: DC | PRN
  Administered 2016-11-24: 4 mg via INTRAVENOUS

## 2016-11-24 MED ORDER — MIDAZOLAM HCL 5 MG/5ML IJ SOLN
INTRAMUSCULAR | Status: DC | PRN
  Administered 2016-11-24: 2 mg via INTRAVENOUS

## 2016-11-24 MED ORDER — KETOROLAC TROMETHAMINE 30 MG/ML IJ SOLN
INTRAMUSCULAR | Status: AC
  Filled 2016-11-24: qty 1

## 2016-11-24 MED ORDER — OXYCODONE-ACETAMINOPHEN 5-325 MG PO TABS: ORAL_TABLET | ORAL | 0 refills | Status: AC

## 2016-11-24 MED ORDER — EPHEDRINE 5 MG/ML INJ
INTRAVENOUS | Status: AC
  Filled 2016-11-24: qty 10

## 2016-11-24 MED ORDER — DEXAMETHASONE SODIUM PHOSPHATE 10 MG/ML IJ SOLN
INTRAMUSCULAR | Status: AC
  Filled 2016-11-24: qty 1

## 2016-11-24 MED ORDER — LIDOCAINE 2% (20 MG/ML) 5 ML SYRINGE
INTRAMUSCULAR | Status: AC
  Filled 2016-11-24: qty 5

## 2016-11-24 MED ORDER — FENTANYL CITRATE (PF) 100 MCG/2ML IJ SOLN
INTRAMUSCULAR | Status: AC
  Filled 2016-11-24: qty 2

## 2016-11-24 MED ORDER — CEFAZOLIN SODIUM-DEXTROSE 2-4 GM/100ML-% IV SOLN
2.0000 g | INTRAVENOUS | Status: AC
  Administered 2016-11-24: 2 g via INTRAVENOUS
  Filled 2016-11-24: qty 100

## 2016-11-24 MED ORDER — GLYCOPYRROLATE 0.2 MG/ML IV SOSY
PREFILLED_SYRINGE | INTRAVENOUS | Status: AC
  Filled 2016-11-24: qty 5

## 2016-11-24 MED ORDER — DEXAMETHASONE SODIUM PHOSPHATE 10 MG/ML IJ SOLN
INTRAMUSCULAR | Status: DC | PRN
  Administered 2016-11-24: 10 mg via INTRAVENOUS

## 2016-11-24 MED ORDER — PROPOFOL 10 MG/ML IV BOLUS
INTRAVENOUS | Status: DC | PRN
  Administered 2016-11-24: 200 mg via INTRAVENOUS

## 2016-11-24 MED ORDER — SODIUM CHLORIDE 0.9 % IR SOLN
Status: DC | PRN
  Administered 2016-11-24: 1000 mL via INTRAVESICAL
  Administered 2016-11-24: 3000 mL via INTRAVESICAL
  Administered 2016-11-24: 1000 mL via INTRAVESICAL

## 2016-11-24 SURGICAL SUPPLY — 27 items
BAG URO CATCHER STRL LF (MISCELLANEOUS) ×2 IMPLANT
BASKET LASER NITINOL 1.9FR (BASKET) ×1 IMPLANT
BASKET ZERO TIP NITINOL 2.4FR (BASKET) IMPLANT
BSKT STON RTRVL 120 1.9FR (BASKET) ×1
BSKT STON RTRVL ZERO TP 2.4FR (BASKET)
CATH INTERMIT  6FR 70CM (CATHETERS) ×2 IMPLANT
CLOTH BEACON ORANGE TIMEOUT ST (SAFETY) ×2 IMPLANT
COVER SURGICAL LIGHT HANDLE (MISCELLANEOUS) ×1 IMPLANT
FIBER LASER FLEXIVA 1000 (UROLOGICAL SUPPLIES) IMPLANT
FIBER LASER FLEXIVA 365 (UROLOGICAL SUPPLIES) IMPLANT
FIBER LASER FLEXIVA 550 (UROLOGICAL SUPPLIES) IMPLANT
FIBER LASER TRAC TIP (UROLOGICAL SUPPLIES) ×1 IMPLANT
GLOVE BIOGEL M STRL SZ7.5 (GLOVE) ×2 IMPLANT
GOWN STRL REUS W/TWL LRG LVL3 (GOWN DISPOSABLE) ×4 IMPLANT
GUIDEWIRE ANG ZIPWIRE 038X150 (WIRE) ×2 IMPLANT
GUIDEWIRE STR DUAL SENSOR (WIRE) ×2 IMPLANT
IV NS 1000ML (IV SOLUTION) ×2
IV NS 1000ML BAXH (IV SOLUTION) ×1 IMPLANT
MANIFOLD NEPTUNE II (INSTRUMENTS) ×2 IMPLANT
PACK CYSTO (CUSTOM PROCEDURE TRAY) ×2 IMPLANT
SHEATH ACCESS URETERAL 24CM (SHEATH) ×1 IMPLANT
SHEATH ACCESS URETERAL 38CM (SHEATH) IMPLANT
SHEATH ACCESS URETERAL 54CM (SHEATH) IMPLANT
STENT POLARIS 5FRX24 (STENTS) ×1 IMPLANT
SYR CONTROL 10ML LL (SYRINGE) ×1 IMPLANT
TUBE FEEDING 8FR 16IN STR KANG (MISCELLANEOUS) ×2 IMPLANT
TUBING CONNECTING 10 (TUBING) ×2 IMPLANT

## 2016-11-24 NOTE — Anesthesia Postprocedure Evaluation (Signed)
Anesthesia Post Note  Patient: Katherine Dodson  Procedure(s) Performed: Procedure(s) (LRB): CYSTOSCOPY/RETROGRADE/URETEROSCOPY/STONE EXTRACTION WITH BASKET (Left) HOLMIUM LASER APPLICATION (Left) CYSTOSCOPY WITH STENT PLACEMENT (Left)  Patient location during evaluation: PACU Anesthesia Type: General and Bier Block Level of consciousness: awake Pain management: pain level controlled Respiratory status: spontaneous breathing Cardiovascular status: stable Anesthetic complications: no       Last Vitals:  Vitals:   11/24/16 1715 11/24/16 1723  BP: (!) 150/82 (!) 147/85  Pulse: 90 (!) 101  Resp: 15 18  Temp: 36.4 C 36.4 C    Last Pain:  Vitals:   11/24/16 1723  TempSrc:   PainSc: 0-No pain                 Marta Bouie

## 2016-11-24 NOTE — Brief Op Note (Signed)
11/24/2016  4:38 PM  PATIENT:  Katherine Dodson  56 y.o. female  PRE-OPERATIVE DIAGNOSIS:  LEFT URETEROPELVIC JUNCTION CALCULUS + multifocal renal   POST-OPERATIVE DIAGNOSIS:  LEFT URETEROPELVIC JUNCTION CALCULUS + multifocal renal   PROCEDURE:  Procedure(s): CYSTOSCOPY/RETROGRADE/URETEROSCOPY/STONE EXTRACTION WITH BASKET (Left) HOLMIUM LASER APPLICATION (Left) CYSTOSCOPY WITH STENT PLACEMENT (Left)  SURGEON:  Surgeon(s) and Role:    * Sebastian Ache, MD - Primary  PHYSICIAN ASSISTANT:   ASSISTANTS: none   ANESTHESIA:   general  EBL:  Total I/O In: 1000 [I.V.:1000] Out: 0   BLOOD ADMINISTERED:none  DRAINS: none   LOCAL MEDICATIONS USED:  NONE  SPECIMEN:  Source of Specimen:  left ureteral and renal stone fragments  DISPOSITION OF SPECIMEN:  [facility] for compositional analysis  COUNTS:  YES  TOURNIQUET:  * No tourniquets in log *  DICTATION: .Other Dictation: Dictation Number [id number]  PLAN OF CARE: Discharge to home after PACU  PATIENT DISPOSITION:  PACU - hemodynamically stable.   Delay start of Pharmacological VTE agent (>24hrs) due to surgical blood loss or risk of bleeding: yes

## 2016-11-24 NOTE — H&P (Signed)
Katherine Dodson is an 56 y.o. female.    Chief Complaint: Pre-op LEFT Ureteroscopic Stone Manipulation  HPI:    1 - Recurrent Nephrolithiasis -  Pre 2016 - ureteral stone passed after stent placement around 1999 10/2014 - Left 1076m mid ureteral stone (SSD 13cm, 325HU, at L3-L4 interspace) passed with medical therapy, also 529mintra-renal by ER CT.  11/2016 - CT with 76m376meft UVJ stone + 76mm176mt upper pole + 76mm 5mt lower pole ==> no passage with medical therapy.   PMH sig for cesarean, TNA, osteopenia. No CV disease No strong blood thinners. Her PCP is Ed HawkiAeronautical engineereidsCadeday "[name]"Webb Silversmithseen to proceed with LEFT ureteroscopic stone manipulation. No interval fevers. Most recent UCX negative.    Past Medical History:  Diagnosis Date  . Anxiety   . Depression   . Headache    hx  . History of kidney stones   . Left ureteral calculus   . Neuromuscular disorder (HCC)    neuropathy feet  . SUI (stress urinary incontinence, female)   . Wears contact lenses     Past Surgical History:  Procedure Laterality Date  . CESAREAN SECTION  1997  . COLONOSCOPY N/A 05/07/2015   Procedure: COLONOSCOPY;  Surgeon: NajeeRogene Houston  Location: [facility];  Service: Endoscopy;  Laterality: N/A;  730  . CYSTOSCOPY W/ URETERAL STENT PLACEMENT  2001  . NASAL SEPTUM SURGERY  1980's  . PERCUTANEOUS PINNING FEMORAL NECK FRACTURE Left 10-25-2009  . TONSILLECTOMY  age 61   66o family history on file. Social History:  reports that she quit smoking about 34 years ago. Her smoking use included Cigarettes. She quit after 7.00 years of use. She has never used smokeless tobacco. She reports that she drinks about 4.2 oz of alcohol per week . She reports that she does not use drugs.  Allergies: No Known Allergies  No prescriptions prior to admission.    Results for orders placed or performed during the hospital encounter of 11/22/16 (from the past 48 hour(s))  Urinalysis, Routine w reflex  microscopic     Status: Abnormal   Collection Time: 11/22/16  4:58 PM  Result Value Ref Range   Color, Urine YELLOW YELLOW   APPearance CLEAR CLEAR   Specific Gravity, Urine 1.016 1.005 - 1.030   pH 5.0 5.0 - 8.0   Glucose, UA NEGATIVE NEGATIVE mg/dL   Hgb urine dipstick LARGE (A) NEGATIVE   Bilirubin Urine NEGATIVE NEGATIVE   Ketones, ur NEGATIVE NEGATIVE mg/dL   Protein, ur NEGATIVE NEGATIVE mg/dL   Nitrite NEGATIVE NEGATIVE   Leukocytes, UA NEGATIVE NEGATIVE   RBC / HPF TOO NUMEROUS TO COUNT 0 - 5 RBC/hpf   WBC, UA 0-5 0 - 5 WBC/hpf   Bacteria, UA NONE SEEN NONE SEEN   Squamous Epithelial / LPF 0-5 (A) NONE SEEN   Mucous PRESENT   Urine culture     Status: None   Collection Time: 11/22/16  5:00 PM  Result Value Ref Range   Specimen Description URINE, CLEAN CATCH    Special Requests NONE    Culture      NO GROWTH Performed at [id number] Status 11/23/2016 FINAL   Basic metabolic panel     Status: Abnormal   Collection Time: 11/22/16  5:38 PM  Result Value Ref Range   Sodium 138 135 - 145 mmol/L   Potassium  4.0 3.5 - 5.1 mmol/L   Chloride 104 101 - 111 mmol/L   CO2 26 22 - 32 mmol/L   Glucose, Bld 98 65 - 99 mg/dL   BUN 21 (H) 6 - 20 mg/dL   Creatinine, Ser 0.76 0.44 - 1.00 mg/dL   Calcium 9.4 8.9 - 10.3 mg/dL   GFR calc non Af Amer >60 >60 mL/min   GFR calc Af Amer >60 >60 mL/min    Comment: (NOTE) The eGFR has been calculated using the CKD EPI equation. This calculation has not been validated in all clinical situations. eGFR's persistently <60 mL/min signify possible Chronic Kidney Disease.    Anion gap 8 5 - 15  CBC with Differential     Status: None   Collection Time: 11/22/16  5:38 PM  Result Value Ref Range   WBC 7.8 4.0 - 10.5 K/uL   RBC 4.39 3.87 - 5.11 MIL/uL   Hemoglobin 13.3 12.0 - 15.0 g/dL   HCT 38.3 36.0 - 46.0 %   MCV 87.2 78.0 - 100.0 fL   MCH 30.3 26.0 - 34.0 pg   MCHC 34.7 30.0 -  36.0 g/dL   RDW 12.9 11.5 - 15.5 %   Platelets 253 150 - 400 K/uL   Neutrophils Relative % 63 %   Neutro Abs 5.0 1.7 - 7.7 K/uL   Lymphocytes Relative 27 %   Lymphs Abs 2.1 0.7 - 4.0 K/uL   Monocytes Relative 7 %   Monocytes Absolute 0.6 0.1 - 1.0 K/uL   Eosinophils Relative 2 %   Eosinophils Absolute 0.2 0.0 - 0.7 K/uL   Basophils Relative 1 %   Basophils Absolute 0.0 0.0 - 0.1 K/uL   Ct Renal Stone Study  Result Date: 11/22/2016 CLINICAL DATA:  Left groin/flank pain x4 weeks, history of kidney stone EXAM: CT ABDOMEN AND PELVIS WITHOUT CONTRAST TECHNIQUE: Multidetector CT imaging of the abdomen and pelvis was performed following the standard protocol without IV contrast. COMPARISON:  10/15/2014 FINDINGS: Lower chest: Mild dependent atelectasis the lung bases. Hepatobiliary: Unenhanced liver is unremarkable. Mild layering gallbladder sludge and/or noncalcified gallstones (series 2/ image 31). No associated inflammatory changes. Pancreas: Within normal limits. Spleen: Within normal limits. Adrenals/Urinary Tract: Adrenal glands are within normal limits. 7 mm nonobstructing left upper pole renal calculus (series 2/ image 21). Additional 7 mm nonobstructing left lower pole renal calculus (series 2/ image 32). Right kidney is within normal limits. 7 mm distal left ureteral calculus at the UVJ (coronal image 55). No hydronephrosis. Bladder is within normal limits. Stomach/Bowel: Stomach is within normal limits. No evidence of bowel obstruction. Normal appendix (series 2/ image 62). Vascular/Lymphatic: No evidence of abdominal aortic aneurysm. No suspicious abdominopelvic lymphadenopathy. Reproductive: Uterus is within normal limits. Bilateral ovaries are within normal limits. Other: No abdominopelvic ascites. Musculoskeletal: Mild degenerative changes of the mid lumbar spine. IMPRESSION: 7 mm distal left ureteral calculus at the UVJ.  No hydronephrosis. Two additional nonobstructing left renal calculi  measuring up to 7 mm. Mild layering gallbladder sludge and/or noncalcified gallstones, without associated inflammatory changes. Electronically Signed   By: Julian Hy M.D.   On: 11/22/2016 18:34    Review of Systems  Constitutional: Negative.  Negative for chills and fever.  HENT: Negative.   Eyes: Negative.   Respiratory: Negative.   Cardiovascular: Negative.   Gastrointestinal: Negative.   Genitourinary: Positive for flank pain.  Skin: Negative.   Neurological: Negative.   Endo/Heme/Allergies: Negative.   Psychiatric/Behavioral: Negative.     There were  no vitals taken for this visit. Physical Exam  Constitutional: She appears well-developed.  HENT:  Head: Normocephalic.  Eyes: Pupils are equal, round, and reactive to light.  Neck: Normal range of motion.  Cardiovascular: Normal rate.   Respiratory: Effort normal.  GI: Soft.  Genitourinary:  Genitourinary Comments: Mild LEFT CVAT.   Musculoskeletal: Normal range of motion.  Neurological: She is alert.  Skin: Skin is warm.  Psychiatric: She has a normal mood and affect.     Assessment/Plan   1 - Recurrent Nephrolithiasis - proceed as planned with LEFT ureteroscopy with goal of left side stone free. Risks, benefits, alternatives, expected peri-op course, need for temporary stents / staged approach discussed previously and reiterated today.  Alexis Frock, MD 11/24/2016, 6:33 AM

## 2016-11-24 NOTE — Progress Notes (Signed)
Surgery delayed. Notified patient.

## 2016-11-24 NOTE — Anesthesia Preprocedure Evaluation (Signed)
Anesthesia Evaluation  Patient identified by MRN, date of birth, ID band  Reviewed: Allergy & Precautions  Airway Mallampati: II  TM Distance: >3 FB     Dental   Pulmonary neg pulmonary ROS, former smoker,    breath sounds clear to auscultation       Cardiovascular negative cardio ROS   Rhythm:Regular Rate:Normal     Neuro/Psych  Headaches, Anxiety Depression  Neuromuscular disease    GI/Hepatic negative GI ROS, Neg liver ROS,   Endo/Other    Renal/GU Renal disease     Musculoskeletal   Abdominal   Peds  Hematology   Anesthesia Other Findings   Reproductive/Obstetrics                             Anesthesia Physical Anesthesia Plan  ASA: II  Anesthesia Plan: General   Post-op Pain Management:    Induction: Intravenous  Airway Management Planned: LMA  Additional Equipment:   Intra-op Plan:   Post-operative Plan: Extubation in OR  Informed Consent: I have reviewed the patients History and Physical, chart, labs and discussed the procedure including the risks, benefits and alternatives for the proposed anesthesia with the patient or authorized representative who has indicated his/her understanding and acceptance.   Dental advisory given  Plan Discussed with: CRNA and Anesthesiologist  Anesthesia Plan Comments:         Anesthesia Quick Evaluation

## 2016-11-24 NOTE — Anesthesia Procedure Notes (Signed)
Procedure Name: LMA Insertion Date/Time: 11/24/2016 3:50 PM Performed by: Jhonnie Garner Pre-anesthesia Checklist: Emergency Drugs available, Suction available, Patient being monitored and Patient identified Patient Re-evaluated:Patient Re-evaluated prior to inductionOxygen Delivery Method: Circle system utilized Preoxygenation: Pre-oxygenation with 100% oxygen Intubation Type: IV induction Ventilation: Mask ventilation without difficulty LMA: LMA inserted LMA Size: 4.0 Number of attempts: 1 Placement Confirmation: breath sounds checked- equal and bilateral Tube secured with: Tape Dental Injury: Teeth and Oropharynx as per pre-operative assessment

## 2016-11-24 NOTE — Discharge Instructions (Signed)
1 - You may have urinary urgency (bladder spasms) and bloody urine on / off with stent in place. This is normal.  2 - Remove tethered stent on Monday morning at home by pulling on string, then blue-white plastic tubing and discarding. Office is open Monday if any acute issues arise.   3 -  Call MD or go to ER for fever >102, severe pain / nausea / vomiting not relieved by medications, or acute change in medical status

## 2016-11-24 NOTE — Transfer of Care (Signed)
Immediate Anesthesia Transfer of Care Note  Patient: Katherine Dodson  Procedure(s) Performed: Procedure(s): CYSTOSCOPY/RETROGRADE/URETEROSCOPY/STONE EXTRACTION WITH BASKET (Left) HOLMIUM LASER APPLICATION (Left) CYSTOSCOPY WITH STENT PLACEMENT (Left)  Patient Location: PACU  Anesthesia Type:General  Level of Consciousness:  sedated, patient cooperative and responds to stimulation  Airway & Oxygen Therapy:Patient Spontanous Breathing and Patient connected to face mask oxgen  Post-op Assessment:  Report given to PACU RN and Post -op Vital signs reviewed and stable  Post vital signs:  Reviewed and stable  Last Vitals:  Vitals:   11/24/16 1243  BP: (!) 149/78  Pulse: 80  Resp: 18  Temp: 36.8 C    Complications: No apparent anesthesia complications

## 2016-11-24 NOTE — Interval H&P Note (Signed)
History and Physical Interval Note:  11/24/2016 3:39 PM  Katherine Dodson  has presented today for surgery, with the diagnosis of LEFT URETEROPELVIC JUNCTION CALCULUS  The various methods of treatment have been discussed with the patient and family. After consideration of risks, benefits and other options for treatment, the patient has consented to  Procedure(s): CYSTOSCOPY/RETROGRADE/URETEROSCOPY/STONE EXTRACTION WITH BASKET (Left) HOLMIUM LASER APPLICATION (Left) CYSTOSCOPY WITH STENT PLACEMENT (Left) as a surgical intervention .  The patient's history has been reviewed, patient examined, no change in status, stable for surgery.  I have reviewed the patient's chart and labs.  Questions were answered to the patient's satisfaction.     Jerrol Helmers

## 2016-11-27 NOTE — Op Note (Signed)
NAME:  [name], [name]                       ACCOUNT NO.:  MEDICAL RECORD NO.:  [id number]  LOCATION:                                 FACILITY:  PHYSICIAN:  Sebastian Ache, MD          DATE OF BIRTH:  DATE OF PROCEDURE: 11/24/2016                               OPERATIVE REPORT   PREOPERATIVE DIAGNOSIS:  Left ureteral and renal stones.  PROCEDURE: 1. Cystoscopy with left pyelogram interpretation. 2. Left ureteroscopy with laser lithotripsy. 3. Insertion of left ureteral stent.  ESTIMATED BLOOD LOSS:  Nil.  COMPLICATIONS:  None.  SPECIMEN:  Left ureteral and renal stones for compositional analysis.  FINDINGS: 1. Impacted left distal ureteral stone as anticipated. 2. Mild hydroureteronephrosis above left distal ureteral stone. 3. Multifocal left intrarenal stone, upper and lower mid-pole. 4. Complete resolution of all stone fragments larger than 1/3rd mm     following laser lithotripsy and basket extraction and the left     kidney and ureter. 5. Successful placement of left ureteral stent, proximal in renal     pelvis and distal in the bladder.  INDICATION:  Katherine Dodson is a 56 year old lady with history of recurrent nephrolithiasis, found to have colicky flank pain and having a large left distal ureteral stone approximately 7 mm.  She also has multifocal left intrarenal stone 7 mm x 2.  She was given a trial of medical therapy, but failed to pass her ureteral stone.  Options were discussed for management including continued medical therapy versus shockwave lithotripsy versus ureteroscopy, and she wished to proceed with the latter with goal of ipsilateral stone free.  Informed consent was obtained and placed in the medical record.  PROCEDURE IN DETAIL:  The patient being, Katherine Dodson.  Procedure being left ureteroscopic stone manipulation was confirmed.  Procedure was carried out.  Time-out was performed.  Intravenous antibiotics were administered.  General LMA anesthesia induced.   The patient placed into a low lithotomy position.  Sterile field was created by prepping and draping the patient's vagina, introitus, and proximal thighs using iodine.  Next, cystourethroscopy was performed using a rigid cystoscope with offset lens.  Inspection of the urinary bladder revealed no diverticula, calcifications, papillary lesions.  The left ureteral orifice was cannulated with a 6-French end-hole catheter and left retrograde pyelogram was obtained.  Left retrograde pyelogram demonstrated a single left ureter with single- system left kidney.  There was a filling defect in the distal ureter consistent with known stone.  There was mild hydroureteronephrosis above this.  A 0.038 Zip wire was advanced to the level of the upper pole, set aside as a safety wire.  An 8-French feeding tube was placed in the urinary bladder for pressure release and semi-rigid ureteroscopy performed in the distal left ureter alongside a separate Sensor working wire.  As expected, there was a distal left ureteral stone at approximately the level of the intramural ureter, appeared to be much too large for simple basketing.  As such, holmium laser energy was applied to the stone using settings of 0.3 joules and 30 Hz, fragmenting the stone in approximately 4 smaller pieces, which were  then sequentially grasped on the long axis, removed and set aside for compositional analysis.  Semi-rigid ureteroscopy was then performed of the remaining distal four-fifths of the left ureter alongside a separate Sensor working wire.  No mucosal abnormalities were found.  As the goal was ipsilateral stone free, the semi-rigid ureteroscope was exchanged for a 12/14, 24-cm ureteral access sheath at the level of proximal ureter and systematic inspection was performed in the left proximal ureter and inspection of the left kidney including all calices x3. There were 2 foci of intrarenal stone, one dominant upper pole and  one dominant lower mid, that were also much too large for simple basketing. As such, holmium laser energy was applied to theses stones using the previous settings, fragmenting these smaller pieces that were then sequentially grasped, removed in their entirety, set aside for compositional analysis.  Following these maneuvers, excellent hemostasis.  No evidence of renal perforation.  All stone fragments larger than 1/3rd mm had been removed.  The access sheath was removed under continuous vision.  No mucosal abnormalities were found.  Given access sheath usage and multifocal nature of stone, it was felt that brief interval stenting would be warranted.  As such, a new 5 x 24 Polaris-type stent was placed with remaining safety wire using fluoroscopic guidance.  Good proximal and distal deployment were noted. Tether was left in place and fashioned to the thigh and the procedure was terminated.  The patient tolerated the procedure well.  There were no immediate periprocedural complications.  The patient was taken to the postanesthesia care in stable condition.          ______________________________ Sebastian Ache, MD     TM/MEDQ  D:  11/24/2016  T:  11/24/2016  Job:  [id number]

## 2016-12-29 DIAGNOSIS — S134XXA Sprain of ligaments of cervical spine, initial encounter: Secondary | ICD-10-CM | POA: Diagnosis not present

## 2016-12-29 DIAGNOSIS — M546 Pain in thoracic spine: Secondary | ICD-10-CM | POA: Diagnosis not present

## 2016-12-29 DIAGNOSIS — M47816 Spondylosis without myelopathy or radiculopathy, lumbar region: Secondary | ICD-10-CM | POA: Diagnosis not present

## 2017-02-08 DIAGNOSIS — N393 Stress incontinence (female) (male): Secondary | ICD-10-CM | POA: Diagnosis not present

## 2017-02-08 DIAGNOSIS — N200 Calculus of kidney: Secondary | ICD-10-CM | POA: Diagnosis not present

## 2017-02-23 DIAGNOSIS — N200 Calculus of kidney: Secondary | ICD-10-CM | POA: Diagnosis not present

## 2017-02-24 DIAGNOSIS — N200 Calculus of kidney: Secondary | ICD-10-CM | POA: Diagnosis not present

## 2017-02-27 DIAGNOSIS — N202 Calculus of kidney with calculus of ureter: Secondary | ICD-10-CM | POA: Diagnosis not present

## 2017-02-27 DIAGNOSIS — F321 Major depressive disorder, single episode, moderate: Secondary | ICD-10-CM | POA: Diagnosis not present

## 2017-02-27 DIAGNOSIS — J301 Allergic rhinitis due to pollen: Secondary | ICD-10-CM | POA: Diagnosis not present

## 2017-02-27 DIAGNOSIS — G4700 Insomnia, unspecified: Secondary | ICD-10-CM | POA: Diagnosis not present

## 2017-04-03 DIAGNOSIS — R34 Anuria and oliguria: Secondary | ICD-10-CM | POA: Diagnosis not present

## 2017-04-03 DIAGNOSIS — N200 Calculus of kidney: Secondary | ICD-10-CM | POA: Diagnosis not present

## 2017-06-27 DIAGNOSIS — Z23 Encounter for immunization: Secondary | ICD-10-CM | POA: Diagnosis not present

## 2018-04-04 DIAGNOSIS — R34 Anuria and oliguria: Secondary | ICD-10-CM | POA: Diagnosis not present

## 2018-04-04 DIAGNOSIS — N200 Calculus of kidney: Secondary | ICD-10-CM | POA: Diagnosis not present

## 2018-04-04 DIAGNOSIS — N393 Stress incontinence (female) (male): Secondary | ICD-10-CM | POA: Diagnosis not present

## 2018-04-23 DIAGNOSIS — G4700 Insomnia, unspecified: Secondary | ICD-10-CM | POA: Diagnosis not present

## 2018-04-23 DIAGNOSIS — G609 Hereditary and idiopathic neuropathy, unspecified: Secondary | ICD-10-CM | POA: Diagnosis not present

## 2018-04-23 DIAGNOSIS — M810 Age-related osteoporosis without current pathological fracture: Secondary | ICD-10-CM | POA: Diagnosis not present

## 2018-04-23 DIAGNOSIS — Z23 Encounter for immunization: Secondary | ICD-10-CM | POA: Diagnosis not present

## 2018-04-23 DIAGNOSIS — J301 Allergic rhinitis due to pollen: Secondary | ICD-10-CM | POA: Diagnosis not present

## 2018-05-10 DIAGNOSIS — Z1231 Encounter for screening mammogram for malignant neoplasm of breast: Secondary | ICD-10-CM | POA: Diagnosis not present

## 2018-05-10 DIAGNOSIS — Z6831 Body mass index (BMI) 31.0-31.9, adult: Secondary | ICD-10-CM | POA: Diagnosis not present

## 2018-05-10 DIAGNOSIS — Z01419 Encounter for gynecological examination (general) (routine) without abnormal findings: Secondary | ICD-10-CM | POA: Diagnosis not present

## 2018-09-10 DIAGNOSIS — G4700 Insomnia, unspecified: Secondary | ICD-10-CM | POA: Diagnosis not present

## 2018-09-10 DIAGNOSIS — R002 Palpitations: Secondary | ICD-10-CM | POA: Diagnosis not present

## 2018-09-10 DIAGNOSIS — F321 Major depressive disorder, single episode, moderate: Secondary | ICD-10-CM | POA: Diagnosis not present

## 2018-09-10 DIAGNOSIS — J019 Acute sinusitis, unspecified: Secondary | ICD-10-CM | POA: Diagnosis not present

## 2018-10-22 DIAGNOSIS — G4700 Insomnia, unspecified: Secondary | ICD-10-CM | POA: Diagnosis not present

## 2018-10-22 DIAGNOSIS — J301 Allergic rhinitis due to pollen: Secondary | ICD-10-CM | POA: Diagnosis not present

## 2018-10-22 DIAGNOSIS — G609 Hereditary and idiopathic neuropathy, unspecified: Secondary | ICD-10-CM | POA: Diagnosis not present

## 2018-10-22 DIAGNOSIS — E785 Hyperlipidemia, unspecified: Secondary | ICD-10-CM | POA: Diagnosis not present

## 2019-02-24 DIAGNOSIS — Z8262 Family history of osteoporosis: Secondary | ICD-10-CM | POA: Diagnosis not present

## 2019-02-24 DIAGNOSIS — M8589 Other specified disorders of bone density and structure, multiple sites: Secondary | ICD-10-CM | POA: Diagnosis not present

## 2019-02-24 DIAGNOSIS — Z8781 Personal history of (healed) traumatic fracture: Secondary | ICD-10-CM | POA: Diagnosis not present

## 2019-04-02 DIAGNOSIS — E785 Hyperlipidemia, unspecified: Secondary | ICD-10-CM | POA: Diagnosis not present

## 2019-04-02 DIAGNOSIS — G609 Hereditary and idiopathic neuropathy, unspecified: Secondary | ICD-10-CM | POA: Diagnosis not present

## 2019-04-02 DIAGNOSIS — R739 Hyperglycemia, unspecified: Secondary | ICD-10-CM | POA: Diagnosis not present

## 2019-04-02 DIAGNOSIS — J301 Allergic rhinitis due to pollen: Secondary | ICD-10-CM | POA: Diagnosis not present

## 2019-04-02 DIAGNOSIS — G4700 Insomnia, unspecified: Secondary | ICD-10-CM | POA: Diagnosis not present

## 2019-04-08 DIAGNOSIS — N393 Stress incontinence (female) (male): Secondary | ICD-10-CM | POA: Diagnosis not present

## 2019-04-08 DIAGNOSIS — R3129 Other microscopic hematuria: Secondary | ICD-10-CM | POA: Diagnosis not present

## 2019-04-08 DIAGNOSIS — R34 Anuria and oliguria: Secondary | ICD-10-CM | POA: Diagnosis not present

## 2019-04-08 DIAGNOSIS — N200 Calculus of kidney: Secondary | ICD-10-CM | POA: Diagnosis not present

## 2019-04-29 DIAGNOSIS — Z23 Encounter for immunization: Secondary | ICD-10-CM | POA: Diagnosis not present

## 2019-04-29 DIAGNOSIS — J301 Allergic rhinitis due to pollen: Secondary | ICD-10-CM | POA: Diagnosis not present

## 2019-04-29 DIAGNOSIS — G4700 Insomnia, unspecified: Secondary | ICD-10-CM | POA: Diagnosis not present

## 2019-06-04 DIAGNOSIS — Z124 Encounter for screening for malignant neoplasm of cervix: Secondary | ICD-10-CM | POA: Diagnosis not present

## 2019-06-04 DIAGNOSIS — Z1231 Encounter for screening mammogram for malignant neoplasm of breast: Secondary | ICD-10-CM | POA: Diagnosis not present

## 2019-06-04 DIAGNOSIS — Z01419 Encounter for gynecological examination (general) (routine) without abnormal findings: Secondary | ICD-10-CM | POA: Diagnosis not present

## 2019-06-04 DIAGNOSIS — Z6831 Body mass index (BMI) 31.0-31.9, adult: Secondary | ICD-10-CM | POA: Diagnosis not present

## 2019-07-02 DIAGNOSIS — G43009 Migraine without aura, not intractable, without status migrainosus: Secondary | ICD-10-CM | POA: Diagnosis not present

## 2019-07-02 DIAGNOSIS — G9009 Other idiopathic peripheral autonomic neuropathy: Secondary | ICD-10-CM | POA: Diagnosis not present

## 2019-07-02 DIAGNOSIS — G4700 Insomnia, unspecified: Secondary | ICD-10-CM | POA: Diagnosis not present

## 2019-07-02 DIAGNOSIS — M810 Age-related osteoporosis without current pathological fracture: Secondary | ICD-10-CM | POA: Diagnosis not present

## 2019-07-07 ENCOUNTER — Other Ambulatory Visit: Payer: Self-pay

## 2019-07-07 DIAGNOSIS — Z20822 Contact with and (suspected) exposure to covid-19: Secondary | ICD-10-CM

## 2019-07-08 LAB — NOVEL CORONAVIRUS, NAA: SARS-CoV-2, NAA: NOT DETECTED

## 2019-07-22 DIAGNOSIS — B360 Pityriasis versicolor: Secondary | ICD-10-CM | POA: Diagnosis not present

## 2019-07-22 DIAGNOSIS — L918 Other hypertrophic disorders of the skin: Secondary | ICD-10-CM | POA: Diagnosis not present

## 2019-08-18 DIAGNOSIS — J309 Allergic rhinitis, unspecified: Secondary | ICD-10-CM | POA: Diagnosis not present

## 2019-09-03 DIAGNOSIS — Z03818 Encounter for observation for suspected exposure to other biological agents ruled out: Secondary | ICD-10-CM | POA: Diagnosis not present

## 2019-10-30 DIAGNOSIS — Z23 Encounter for immunization: Secondary | ICD-10-CM | POA: Diagnosis not present

## 2019-11-20 DIAGNOSIS — R7301 Impaired fasting glucose: Secondary | ICD-10-CM | POA: Diagnosis not present

## 2019-11-20 DIAGNOSIS — Z0000 Encounter for general adult medical examination without abnormal findings: Secondary | ICD-10-CM | POA: Diagnosis not present

## 2019-12-03 DIAGNOSIS — G9009 Other idiopathic peripheral autonomic neuropathy: Secondary | ICD-10-CM | POA: Diagnosis not present

## 2019-12-03 DIAGNOSIS — G43009 Migraine without aura, not intractable, without status migrainosus: Secondary | ICD-10-CM | POA: Diagnosis not present

## 2019-12-03 DIAGNOSIS — J302 Other seasonal allergic rhinitis: Secondary | ICD-10-CM | POA: Diagnosis not present

## 2019-12-03 DIAGNOSIS — M810 Age-related osteoporosis without current pathological fracture: Secondary | ICD-10-CM | POA: Diagnosis not present

## 2019-12-08 DIAGNOSIS — Z23 Encounter for immunization: Secondary | ICD-10-CM | POA: Diagnosis not present

## 2020-01-20 DIAGNOSIS — X32XXXD Exposure to sunlight, subsequent encounter: Secondary | ICD-10-CM | POA: Diagnosis not present

## 2020-01-20 DIAGNOSIS — L918 Other hypertrophic disorders of the skin: Secondary | ICD-10-CM | POA: Diagnosis not present

## 2020-01-20 DIAGNOSIS — L570 Actinic keratosis: Secondary | ICD-10-CM | POA: Diagnosis not present

## 2020-04-15 DIAGNOSIS — N200 Calculus of kidney: Secondary | ICD-10-CM | POA: Diagnosis not present

## 2020-05-26 DIAGNOSIS — G4700 Insomnia, unspecified: Secondary | ICD-10-CM | POA: Diagnosis not present

## 2020-05-26 DIAGNOSIS — M810 Age-related osteoporosis without current pathological fracture: Secondary | ICD-10-CM | POA: Diagnosis not present

## 2020-05-26 DIAGNOSIS — E785 Hyperlipidemia, unspecified: Secondary | ICD-10-CM | POA: Diagnosis not present

## 2020-05-26 DIAGNOSIS — R7301 Impaired fasting glucose: Secondary | ICD-10-CM | POA: Diagnosis not present

## 2020-06-09 DIAGNOSIS — Z1152 Encounter for screening for COVID-19: Secondary | ICD-10-CM | POA: Diagnosis not present

## 2020-06-09 DIAGNOSIS — J069 Acute upper respiratory infection, unspecified: Secondary | ICD-10-CM | POA: Diagnosis not present

## 2020-07-29 DIAGNOSIS — Z23 Encounter for immunization: Secondary | ICD-10-CM | POA: Diagnosis not present

## 2020-07-29 DIAGNOSIS — Z6833 Body mass index (BMI) 33.0-33.9, adult: Secondary | ICD-10-CM | POA: Diagnosis not present

## 2020-07-29 DIAGNOSIS — Z1231 Encounter for screening mammogram for malignant neoplasm of breast: Secondary | ICD-10-CM | POA: Diagnosis not present

## 2020-07-29 DIAGNOSIS — M8580 Other specified disorders of bone density and structure, unspecified site: Secondary | ICD-10-CM | POA: Diagnosis not present

## 2020-07-29 DIAGNOSIS — Z01419 Encounter for gynecological examination (general) (routine) without abnormal findings: Secondary | ICD-10-CM | POA: Diagnosis not present

## 2020-07-29 DIAGNOSIS — Z124 Encounter for screening for malignant neoplasm of cervix: Secondary | ICD-10-CM | POA: Diagnosis not present

## 2020-08-24 DIAGNOSIS — Z23 Encounter for immunization: Secondary | ICD-10-CM | POA: Diagnosis not present

## 2020-08-27 ENCOUNTER — Other Ambulatory Visit: Payer: Commercial Managed Care - PPO

## 2020-09-10 DIAGNOSIS — Z0001 Encounter for general adult medical examination with abnormal findings: Secondary | ICD-10-CM | POA: Diagnosis not present

## 2021-03-18 DIAGNOSIS — J019 Acute sinusitis, unspecified: Secondary | ICD-10-CM | POA: Diagnosis not present

## 2021-06-10 DIAGNOSIS — J209 Acute bronchitis, unspecified: Secondary | ICD-10-CM | POA: Diagnosis not present

## 2021-06-10 DIAGNOSIS — J069 Acute upper respiratory infection, unspecified: Secondary | ICD-10-CM | POA: Diagnosis not present

## 2021-06-10 DIAGNOSIS — J019 Acute sinusitis, unspecified: Secondary | ICD-10-CM | POA: Diagnosis not present

## 2021-06-10 DIAGNOSIS — U071 COVID-19: Secondary | ICD-10-CM | POA: Diagnosis not present

## 2021-06-13 DIAGNOSIS — J019 Acute sinusitis, unspecified: Secondary | ICD-10-CM | POA: Diagnosis not present

## 2021-06-13 DIAGNOSIS — J209 Acute bronchitis, unspecified: Secondary | ICD-10-CM | POA: Diagnosis not present

## 2021-06-13 DIAGNOSIS — U071 COVID-19: Secondary | ICD-10-CM | POA: Diagnosis not present

## 2021-06-24 DIAGNOSIS — E785 Hyperlipidemia, unspecified: Secondary | ICD-10-CM | POA: Diagnosis not present

## 2021-06-27 DIAGNOSIS — F329 Major depressive disorder, single episode, unspecified: Secondary | ICD-10-CM | POA: Diagnosis not present

## 2021-06-27 DIAGNOSIS — G4700 Insomnia, unspecified: Secondary | ICD-10-CM | POA: Diagnosis not present

## 2021-06-27 DIAGNOSIS — E785 Hyperlipidemia, unspecified: Secondary | ICD-10-CM | POA: Diagnosis not present

## 2021-06-27 DIAGNOSIS — R7301 Impaired fasting glucose: Secondary | ICD-10-CM | POA: Diagnosis not present

## 2021-07-15 DIAGNOSIS — Z23 Encounter for immunization: Secondary | ICD-10-CM | POA: Diagnosis not present

## 2021-08-25 DIAGNOSIS — Z124 Encounter for screening for malignant neoplasm of cervix: Secondary | ICD-10-CM | POA: Diagnosis not present

## 2021-08-25 DIAGNOSIS — Z113 Encounter for screening for infections with a predominantly sexual mode of transmission: Secondary | ICD-10-CM | POA: Diagnosis not present

## 2021-08-25 DIAGNOSIS — Z01419 Encounter for gynecological examination (general) (routine) without abnormal findings: Secondary | ICD-10-CM | POA: Diagnosis not present

## 2021-08-25 DIAGNOSIS — Z6836 Body mass index (BMI) 36.0-36.9, adult: Secondary | ICD-10-CM | POA: Diagnosis not present

## 2021-08-25 DIAGNOSIS — Z1231 Encounter for screening mammogram for malignant neoplasm of breast: Secondary | ICD-10-CM | POA: Diagnosis not present

## 2021-11-07 DIAGNOSIS — R34 Anuria and oliguria: Secondary | ICD-10-CM | POA: Diagnosis not present

## 2021-11-07 DIAGNOSIS — N200 Calculus of kidney: Secondary | ICD-10-CM | POA: Diagnosis not present

## 2021-11-07 DIAGNOSIS — N393 Stress incontinence (female) (male): Secondary | ICD-10-CM | POA: Diagnosis not present

## 2021-12-10 ENCOUNTER — Ambulatory Visit (INDEPENDENT_AMBULATORY_CARE_PROVIDER_SITE_OTHER): Payer: BC Managed Care – PPO

## 2021-12-10 ENCOUNTER — Ambulatory Visit
Admission: EM | Admit: 2021-12-10 | Discharge: 2021-12-10 | Disposition: A | Discharge status: Home / Self Care | Payer: BC Managed Care – PPO | Attending: Nurse Practitioner | Admitting: Nurse Practitioner

## 2021-12-10 DIAGNOSIS — N3000 Acute cystitis without hematuria: Secondary | ICD-10-CM | POA: Diagnosis not present

## 2021-12-10 DIAGNOSIS — R109 Unspecified abdominal pain: Secondary | ICD-10-CM | POA: Diagnosis not present

## 2021-12-10 DIAGNOSIS — R102 Pelvic and perineal pain: Secondary | ICD-10-CM

## 2021-12-10 DIAGNOSIS — R1084 Generalized abdominal pain: Secondary | ICD-10-CM

## 2021-12-10 DIAGNOSIS — N10 Acute tubulo-interstitial nephritis: Secondary | ICD-10-CM | POA: Diagnosis not present

## 2021-12-10 LAB — POCT URINALYSIS DIP (MANUAL ENTRY)
Bilirubin, UA: NEGATIVE
Glucose, UA: NEGATIVE mg/dL
Ketones, POC UA: NEGATIVE mg/dL
Nitrite, UA: NEGATIVE
Protein Ur, POC: 100 mg/dL — AB
Spec Grav, UA: 1.02 (ref 1.010–1.025)
Urobilinogen, UA: 0.2 E.U./dL
pH, UA: 6.5 (ref 5.0–8.0)

## 2021-12-10 MED ORDER — SULFAMETHOXAZOLE-TRIMETHOPRIM 800-160 MG PO TABS: 1.0000 | ORAL_TABLET | Freq: Two times a day (BID) | ORAL | 0 refills | Status: AC

## 2021-12-10 NOTE — Discharge Instructions (Addendum)
Your urine does show small leukocytes and a large amount of blood.  After has been ordered for confirmatory testing. You will be contacted if your xray is abnormal.  ?Treatment has been prescribed today for 7 days.  Take medication as directed. ?If your urine culture is negative, you will be contacted and asked to stop the antibiotic. ?Continue to increase fluids. ?Tylenol as needed for pain, fever, or general discomfort. ?You should be voiding every 2 hours while symptoms persist. ?If symptoms continue to worsen and you urine culture is negative, you will need to follow-up with your nephrologist. ?

## 2021-12-10 NOTE — ED Provider Notes (Addendum)
[facility]    CSN: [id number] Arrival date & time: 12/10/21  1107      History   Chief Complaint Chief Complaint  Patient presents with   Abdominal Pain   Back Pain   Dysgraphia    HPI Katherine Dodson is a 61 y.o. female.   Patient is a 61 year old female who presents with urinary symptoms.  Symptoms started approximately 3 to 4 days ago.  She states that she began having some burning with urination.  Patient states since that time, she tried to increase her water intake.  Today she woke up with pain in her pelvic region that radiates into the right back.  She also complains of urinary frequency, fever, chills, and mild nausea.  The patient reports that she has a history of kidney stones.  States that she was just seen by the nephrologist approximately 6 weeks ago and everything was fine.  She reports her last kidney stone was approximately 5 years ago.  States that she usually gets kidney stones in the left side.  The history is provided by the patient.  Back Pain Associated symptoms: fever and pelvic pain    Past Medical History:  Diagnosis Date   Anxiety    Depression    Headache    hx   History of kidney stones    Left ureteral calculus    Neuromuscular disorder (HCC)    neuropathy feet   SUI (stress urinary incontinence, female)    Wears contact lenses     Patient Active Problem List   Diagnosis Date Noted   Kidney stone 04/05/2015    Past Surgical History:  Procedure Laterality Date   CESAREAN SECTION  1997   COLONOSCOPY N/A 05/07/2015   Procedure: COLONOSCOPY;  Surgeon: Malissa Hippo, MD;  Location: [facility];  Service: Endoscopy;  Laterality: N/A;  730   CYSTOSCOPY W/ URETERAL STENT PLACEMENT  2001   CYSTOSCOPY WITH STENT PLACEMENT Left 11/24/2016   Procedure: CYSTOSCOPY WITH STENT PLACEMENT;  Surgeon: Sebastian Ache, MD;  Location: [facility];  Service: Urology;  Laterality: Left;   CYSTOSCOPY/RETROGRADE/URETEROSCOPY/STONE EXTRACTION WITH  BASKET Left 11/24/2016   Procedure: CYSTOSCOPY/RETROGRADE/URETEROSCOPY/STONE EXTRACTION WITH BASKET;  Surgeon: Sebastian Ache, MD;  Location: [facility];  Service: Urology;  Laterality: Left;   HOLMIUM LASER APPLICATION Left 11/24/2016   Procedure: HOLMIUM LASER APPLICATION;  Surgeon: Sebastian Ache, MD;  Location: [facility];  Service: Urology;  Laterality: Left;   NASAL SEPTUM SURGERY  1980's   PERCUTANEOUS PINNING FEMORAL NECK FRACTURE Left 10-25-2009   TONSILLECTOMY  age 71    OB History   No obstetric history on file.      Home Medications    Prior to Admission medications   Medication Sig Start Date End Date Taking? Authorizing Provider  sulfamethoxazole-trimethoprim (BACTRIM DS) 800-160 MG tablet Take 1 tablet by mouth 2 (two) times daily for 7 days. 12/10/21 12/17/21 Yes Leath-Warren, Sadie Haber, NP  alendronate (FOSAMAX) 70 MG tablet Take 70 mg by mouth every Sunday. Take with a full glass of water on an empty stomach.    [provider]  Calcium Carb-Cholecalciferol (CALCIUM 600 + D PO) Take 2 tablets by mouth daily with supper.    [provider]  cephALEXin (KEFLEX) 500 MG capsule Take 1 capsule (500 mg total) by mouth 2 (two) times daily. X 3 days to prevent infection while stent in place. 11/24/16   Sebastian Ache, MD  diphenhydrAMINE (BENADRYL) 25 mg capsule Take 25-50 mg by  mouth at bedtime.     [provider]  ketorolac (TORADOL) 10 MG tablet Take 1 tablet (10 mg total) by mouth every 6 (six) hours as needed. For mild pain / stent discomfort post-op. Pt has tolerated prior. 11/24/16   Sebastian Ache, MD  Multiple Vitamin (MULTIVITAMIN WITH MINERALS) TABS tablet Take 1 tablet by mouth daily with supper.    [provider]  oxyCODONE-acetaminophen (PERCOCET/ROXICET) 5-325 MG tablet 1 or 2 tabs PO q6h prn breakthrough pain post-operatively 11/24/16   Sebastian Ache, MD  senna-docusate (SENOKOT-S) 8.6-50 MG tablet Take 1 tablet by mouth 2 (two) times  daily. While taking strongest pain meds to prevent constipation. 11/24/16   Sebastian Ache, MD  sertraline (ZOLOFT) 50 MG tablet Take 50 mg by mouth at bedtime.    [provider]  zolpidem (AMBIEN CR) 12.5 MG CR tablet Take 12.5 mg by mouth at bedtime.     [provider]    Family History History reviewed. No pertinent family history.  Social History Social History   Tobacco Use   Smoking status: Former    Years: 7.00    Types: Cigarettes    Quit date: 10/28/1982    Years since quitting: 39.1   Smokeless tobacco: Never  Substance Use Topics   Alcohol use: Yes    Alcohol/week: 7.0 standard drinks    Types: 7 Glasses of wine per week    Comment: ONE WINE DAILY or a beer   Drug use: No     Allergies   Patient has no known allergies.   Review of Systems Review of Systems  Constitutional:  Positive for chills, fatigue and fever.  Cardiovascular: Negative.   Gastrointestinal:  Positive for nausea.  Genitourinary:  Positive for flank pain, frequency, pelvic pain and urgency.  Skin: Negative.   Neurological: Negative.   Psychiatric/Behavioral: Negative.      Physical Exam Triage Vital Signs ED Triage Vitals  Enc Vitals Group     BP 12/10/21 1144 (!) 155/84     Pulse Rate 12/10/21 1144 89     Resp 12/10/21 1144 18     Temp 12/10/21 1144 98.2 F (36.8 C)     Temp Source 12/10/21 1144 Oral     SpO2 12/10/21 1144 92 %     Weight --      Height --      Head Circumference --      Peak Flow --      Pain Score 12/10/21 1143 5     Pain Loc --      Pain Edu? --      Excl. in GC? --    No data found.  Updated Vital Signs BP (!) 155/84 (BP Location: Right Arm)   Pulse 89   Temp 98.2 F (36.8 C) (Oral)   Resp 18   SpO2 92%   Visual Acuity Right Eye Distance:   Left Eye Distance:   Bilateral Distance:    Right Eye Near:   Left Eye Near:    Bilateral Near:     Physical Exam Vitals reviewed.  Constitutional:      Appearance: She is  well-developed.  HENT:     Head: Normocephalic and atraumatic.  Cardiovascular:     Rate and Rhythm: Normal rate and regular rhythm.     Heart sounds: Normal heart sounds.  Pulmonary:     Effort: Pulmonary effort is normal.     Breath sounds: Normal breath sounds.  Abdominal:  General: Bowel sounds are normal. There is no distension.     Palpations: Abdomen is soft.     Tenderness: There is abdominal tenderness in the right lower quadrant. There is right CVA tenderness. There is no left CVA tenderness.  Skin:    General: Skin is warm and dry.     Capillary Refill: Capillary refill takes less than 2 seconds.  Neurological:     General: No focal deficit present.     Mental Status: She is alert and oriented to person, place, and time.  Psychiatric:        Mood and Affect: Mood normal.        Behavior: Behavior normal.     UC Treatments / Results  Labs (all labs ordered are listed, but only abnormal results are displayed) Labs Reviewed  POCT URINALYSIS DIP (MANUAL ENTRY) - Abnormal; Notable for the following components:      Result Value   Clarity, UA hazy (*)    Blood, UA large (*)    Protein Ur, POC =100 (*)    Leukocytes, UA Small (1+) (*)    All other components within normal limits    EKG   Radiology DG Abd 1 View  Result Date: 12/10/2021 CLINICAL DATA:  Abdominal and pelvic pain. History of urinary calculi. EXAM: ABDOMEN - 1 VIEW COMPARISON:  11/07/2021 radiograph and prior studies FINDINGS: No definite calcifications are noted overlying the renal shadows or expected course the ureters. Small hyperdensities within colon/bowel noted. No dilated bowel loops are present. No acute bony abnormalities are noted. Surgical hardware within proximal LEFT femur noted. IMPRESSION: 1. No definite radiopaque urinary calculi identified. Recommend CT if there is strong clinical suspicion for nephrolithiasis/obstructing urinary calculi. 2. No evidence of bowel obstruction.  Electronically Signed   By: Harmon Pier M.D.   On: 12/10/2021 12:48    Procedures Procedures (including critical care time)  Medications Ordered in UC Medications - No data to display  Initial Impression / Assessment and Plan / UC Course  I have reviewed the triage vital signs and the nursing notes.  Pertinent labs & imaging results that were available during my care of the patient were reviewed by me and considered in my medical decision making (see chart for details).  The patient is a 61 year old female who presents with urinary symptoms.  Symptoms started approximately 4 days ago, but worsened today.  She complains of dysuria, right lower pelvic pain, right lower back pain, fever, chills, and nausea.  Patient states that she tried to increase her fluids but that did not help her symptoms.  On exam, she has tenderness to the right flank and in the right pelvic region.  She also has CVA tenderness on the right side.  Her urine does show large amount of blood with small leukocytes.  Urine culture has also been ordered.  Patient has a history of kidney stones.  Patient states her symptoms are usually on the left.  No CVA tenderness on the left side or back pain.  Her vital signs are stable at this time, but based on her symptoms, will treat Bactrim for 7 days as this will also cover her for pyelonephritis.  Patient encouraged to continue increasing fluids.  Recommend Tylenol for pain, fever, or general discomfort.  Patient advised that if the urine culture is negative, she will be contacted and asked to stop the medication.  Patient will need to follow-up with her nephrologist if her symptoms continue to persist.  Strict return  precautions were provided to the patient.  Follow-up as needed.  Final Clinical Impressions(s) / UC Diagnoses   Final diagnoses:  Acute cystitis without hematuria     Discharge Instructions      Your urine does show small leukocytes and a large amount of blood.  After  has been ordered for confirmatory testing. You will be contacted if your xray is abnormal.  Treatment has been prescribed today for 7 days.  Take medication as directed. If your urine culture is negative, you will be contacted and asked to stop the antibiotic. Continue to increase fluids. Tylenol as needed for pain, fever, or general discomfort. You should be voiding every 2 hours while symptoms persist. If symptoms continue to worsen and you urine culture is negative, you will need to follow-up with your nephrologist.     ED Prescriptions     Medication Sig Dispense Auth. Provider   sulfamethoxazole-trimethoprim (BACTRIM DS) 800-160 MG tablet Take 1 tablet by mouth 2 (two) times daily for 7 days. 14 tablet Leath-Warren, Sadie Haber, NP      PDMP not reviewed this encounter.   Abran Cantor, NP 12/10/21 1325    Leath-Warren, Sadie Haber, NP 12/10/21 1325

## 2021-12-10 NOTE — ED Triage Notes (Signed)
Pt reports lower abdominal pain, burning when urinating x 4 days; chills, fever and lower back pain since this morning.  ?

## 2021-12-19 DIAGNOSIS — E785 Hyperlipidemia, unspecified: Secondary | ICD-10-CM | POA: Diagnosis not present

## 2021-12-26 DIAGNOSIS — E785 Hyperlipidemia, unspecified: Secondary | ICD-10-CM | POA: Diagnosis not present

## 2021-12-26 DIAGNOSIS — F329 Major depressive disorder, single episode, unspecified: Secondary | ICD-10-CM | POA: Diagnosis not present

## 2021-12-26 DIAGNOSIS — R7301 Impaired fasting glucose: Secondary | ICD-10-CM | POA: Diagnosis not present

## 2021-12-26 DIAGNOSIS — R300 Dysuria: Secondary | ICD-10-CM | POA: Diagnosis not present

## 2021-12-26 DIAGNOSIS — G4700 Insomnia, unspecified: Secondary | ICD-10-CM | POA: Diagnosis not present

## 2021-12-27 DIAGNOSIS — N200 Calculus of kidney: Secondary | ICD-10-CM | POA: Diagnosis not present

## 2021-12-27 DIAGNOSIS — N3000 Acute cystitis without hematuria: Secondary | ICD-10-CM | POA: Diagnosis not present

## 2022-04-07 DIAGNOSIS — M6281 Muscle weakness (generalized): Secondary | ICD-10-CM | POA: Diagnosis not present

## 2022-04-07 DIAGNOSIS — M6289 Other specified disorders of muscle: Secondary | ICD-10-CM | POA: Diagnosis not present

## 2022-04-07 DIAGNOSIS — N393 Stress incontinence (female) (male): Secondary | ICD-10-CM | POA: Diagnosis not present

## 2022-05-10 DIAGNOSIS — J029 Acute pharyngitis, unspecified: Secondary | ICD-10-CM | POA: Diagnosis not present

## 2022-05-10 DIAGNOSIS — J069 Acute upper respiratory infection, unspecified: Secondary | ICD-10-CM | POA: Diagnosis not present

## 2022-05-10 DIAGNOSIS — B349 Viral infection, unspecified: Secondary | ICD-10-CM | POA: Diagnosis not present

## 2022-05-10 DIAGNOSIS — J019 Acute sinusitis, unspecified: Secondary | ICD-10-CM | POA: Diagnosis not present

## 2022-05-22 DIAGNOSIS — M62838 Other muscle spasm: Secondary | ICD-10-CM | POA: Diagnosis not present

## 2022-05-22 DIAGNOSIS — M6281 Muscle weakness (generalized): Secondary | ICD-10-CM | POA: Diagnosis not present

## 2022-05-22 DIAGNOSIS — M6289 Other specified disorders of muscle: Secondary | ICD-10-CM | POA: Diagnosis not present

## 2022-05-22 DIAGNOSIS — N393 Stress incontinence (female) (male): Secondary | ICD-10-CM | POA: Diagnosis not present

## 2022-06-23 DIAGNOSIS — R7301 Impaired fasting glucose: Secondary | ICD-10-CM | POA: Diagnosis not present

## 2022-06-23 DIAGNOSIS — E785 Hyperlipidemia, unspecified: Secondary | ICD-10-CM | POA: Diagnosis not present

## 2022-06-28 DIAGNOSIS — M6289 Other specified disorders of muscle: Secondary | ICD-10-CM | POA: Diagnosis not present

## 2022-06-28 DIAGNOSIS — M6281 Muscle weakness (generalized): Secondary | ICD-10-CM | POA: Diagnosis not present

## 2022-06-28 DIAGNOSIS — M62838 Other muscle spasm: Secondary | ICD-10-CM | POA: Diagnosis not present

## 2022-06-28 DIAGNOSIS — N393 Stress incontinence (female) (male): Secondary | ICD-10-CM | POA: Diagnosis not present

## 2022-06-30 DIAGNOSIS — F329 Major depressive disorder, single episode, unspecified: Secondary | ICD-10-CM | POA: Diagnosis not present

## 2022-06-30 DIAGNOSIS — Z23 Encounter for immunization: Secondary | ICD-10-CM | POA: Diagnosis not present

## 2022-06-30 DIAGNOSIS — G4700 Insomnia, unspecified: Secondary | ICD-10-CM | POA: Diagnosis not present

## 2022-06-30 DIAGNOSIS — E785 Hyperlipidemia, unspecified: Secondary | ICD-10-CM | POA: Diagnosis not present

## 2022-06-30 DIAGNOSIS — R7301 Impaired fasting glucose: Secondary | ICD-10-CM | POA: Diagnosis not present

## 2022-08-21 DIAGNOSIS — E669 Obesity, unspecified: Secondary | ICD-10-CM | POA: Diagnosis not present

## 2022-08-21 DIAGNOSIS — G4700 Insomnia, unspecified: Secondary | ICD-10-CM | POA: Diagnosis not present

## 2022-08-21 DIAGNOSIS — R7301 Impaired fasting glucose: Secondary | ICD-10-CM | POA: Diagnosis not present

## 2022-08-21 DIAGNOSIS — Z6837 Body mass index (BMI) 37.0-37.9, adult: Secondary | ICD-10-CM | POA: Diagnosis not present

## 2022-09-19 DIAGNOSIS — Z01419 Encounter for gynecological examination (general) (routine) without abnormal findings: Secondary | ICD-10-CM | POA: Diagnosis not present

## 2022-09-19 DIAGNOSIS — Z1231 Encounter for screening mammogram for malignant neoplasm of breast: Secondary | ICD-10-CM | POA: Diagnosis not present

## 2022-09-19 DIAGNOSIS — Z6837 Body mass index (BMI) 37.0-37.9, adult: Secondary | ICD-10-CM | POA: Diagnosis not present

## 2022-09-19 DIAGNOSIS — Z124 Encounter for screening for malignant neoplasm of cervix: Secondary | ICD-10-CM | POA: Diagnosis not present

## 2022-10-06 DIAGNOSIS — Z780 Asymptomatic menopausal state: Secondary | ICD-10-CM | POA: Diagnosis not present

## 2022-10-06 DIAGNOSIS — M8589 Other specified disorders of bone density and structure, multiple sites: Secondary | ICD-10-CM | POA: Diagnosis not present

## 2022-12-26 DIAGNOSIS — E785 Hyperlipidemia, unspecified: Secondary | ICD-10-CM | POA: Diagnosis not present

## 2022-12-26 DIAGNOSIS — R7301 Impaired fasting glucose: Secondary | ICD-10-CM | POA: Diagnosis not present

## 2023-01-01 DIAGNOSIS — R7301 Impaired fasting glucose: Secondary | ICD-10-CM | POA: Diagnosis not present

## 2023-01-01 DIAGNOSIS — G4700 Insomnia, unspecified: Secondary | ICD-10-CM | POA: Diagnosis not present

## 2023-01-01 DIAGNOSIS — E669 Obesity, unspecified: Secondary | ICD-10-CM | POA: Diagnosis not present

## 2023-01-01 DIAGNOSIS — Z6837 Body mass index (BMI) 37.0-37.9, adult: Secondary | ICD-10-CM | POA: Diagnosis not present

## 2023-02-20 DIAGNOSIS — L918 Other hypertrophic disorders of the skin: Secondary | ICD-10-CM | POA: Diagnosis not present

## 2023-02-20 DIAGNOSIS — L820 Inflamed seborrheic keratosis: Secondary | ICD-10-CM | POA: Diagnosis not present

## 2023-06-05 DIAGNOSIS — M25512 Pain in left shoulder: Secondary | ICD-10-CM | POA: Diagnosis not present

## 2023-06-27 DIAGNOSIS — M25512 Pain in left shoulder: Secondary | ICD-10-CM | POA: Diagnosis not present

## 2023-06-28 DIAGNOSIS — R7301 Impaired fasting glucose: Secondary | ICD-10-CM | POA: Diagnosis not present

## 2023-06-28 DIAGNOSIS — E785 Hyperlipidemia, unspecified: Secondary | ICD-10-CM | POA: Diagnosis not present

## 2023-07-04 DIAGNOSIS — Z23 Encounter for immunization: Secondary | ICD-10-CM | POA: Diagnosis not present

## 2023-07-04 DIAGNOSIS — R7301 Impaired fasting glucose: Secondary | ICD-10-CM | POA: Diagnosis not present

## 2023-07-04 DIAGNOSIS — E785 Hyperlipidemia, unspecified: Secondary | ICD-10-CM | POA: Diagnosis not present

## 2023-07-04 DIAGNOSIS — F5105 Insomnia due to other mental disorder: Secondary | ICD-10-CM | POA: Diagnosis not present

## 2023-07-04 DIAGNOSIS — M7502 Adhesive capsulitis of left shoulder: Secondary | ICD-10-CM | POA: Diagnosis not present

## 2023-07-04 DIAGNOSIS — G4700 Insomnia, unspecified: Secondary | ICD-10-CM | POA: Diagnosis not present

## 2023-07-04 DIAGNOSIS — E669 Obesity, unspecified: Secondary | ICD-10-CM | POA: Diagnosis not present

## 2023-09-25 DIAGNOSIS — Z01419 Encounter for gynecological examination (general) (routine) without abnormal findings: Secondary | ICD-10-CM | POA: Diagnosis not present

## 2023-09-25 DIAGNOSIS — Z1331 Encounter for screening for depression: Secondary | ICD-10-CM | POA: Diagnosis not present

## 2023-09-25 DIAGNOSIS — Z1231 Encounter for screening mammogram for malignant neoplasm of breast: Secondary | ICD-10-CM | POA: Diagnosis not present

## 2023-11-22 DIAGNOSIS — N393 Stress incontinence (female) (male): Secondary | ICD-10-CM | POA: Diagnosis not present

## 2023-11-22 DIAGNOSIS — N200 Calculus of kidney: Secondary | ICD-10-CM | POA: Diagnosis not present

## 2023-11-22 DIAGNOSIS — R34 Anuria and oliguria: Secondary | ICD-10-CM | POA: Diagnosis not present

## 2023-12-03 IMAGING — DX DG ABDOMEN 1V
2 series · 2 of 2 positions shown · non-contrast
Comparison: 11/07/2021 radiograph and prior studies

CLINICAL DATA: Abdominal and pelvic pain. History of urinary
calculi.

EXAM:
ABDOMEN - 1 VIEW

[abdomen standing ap (1 of 2)]
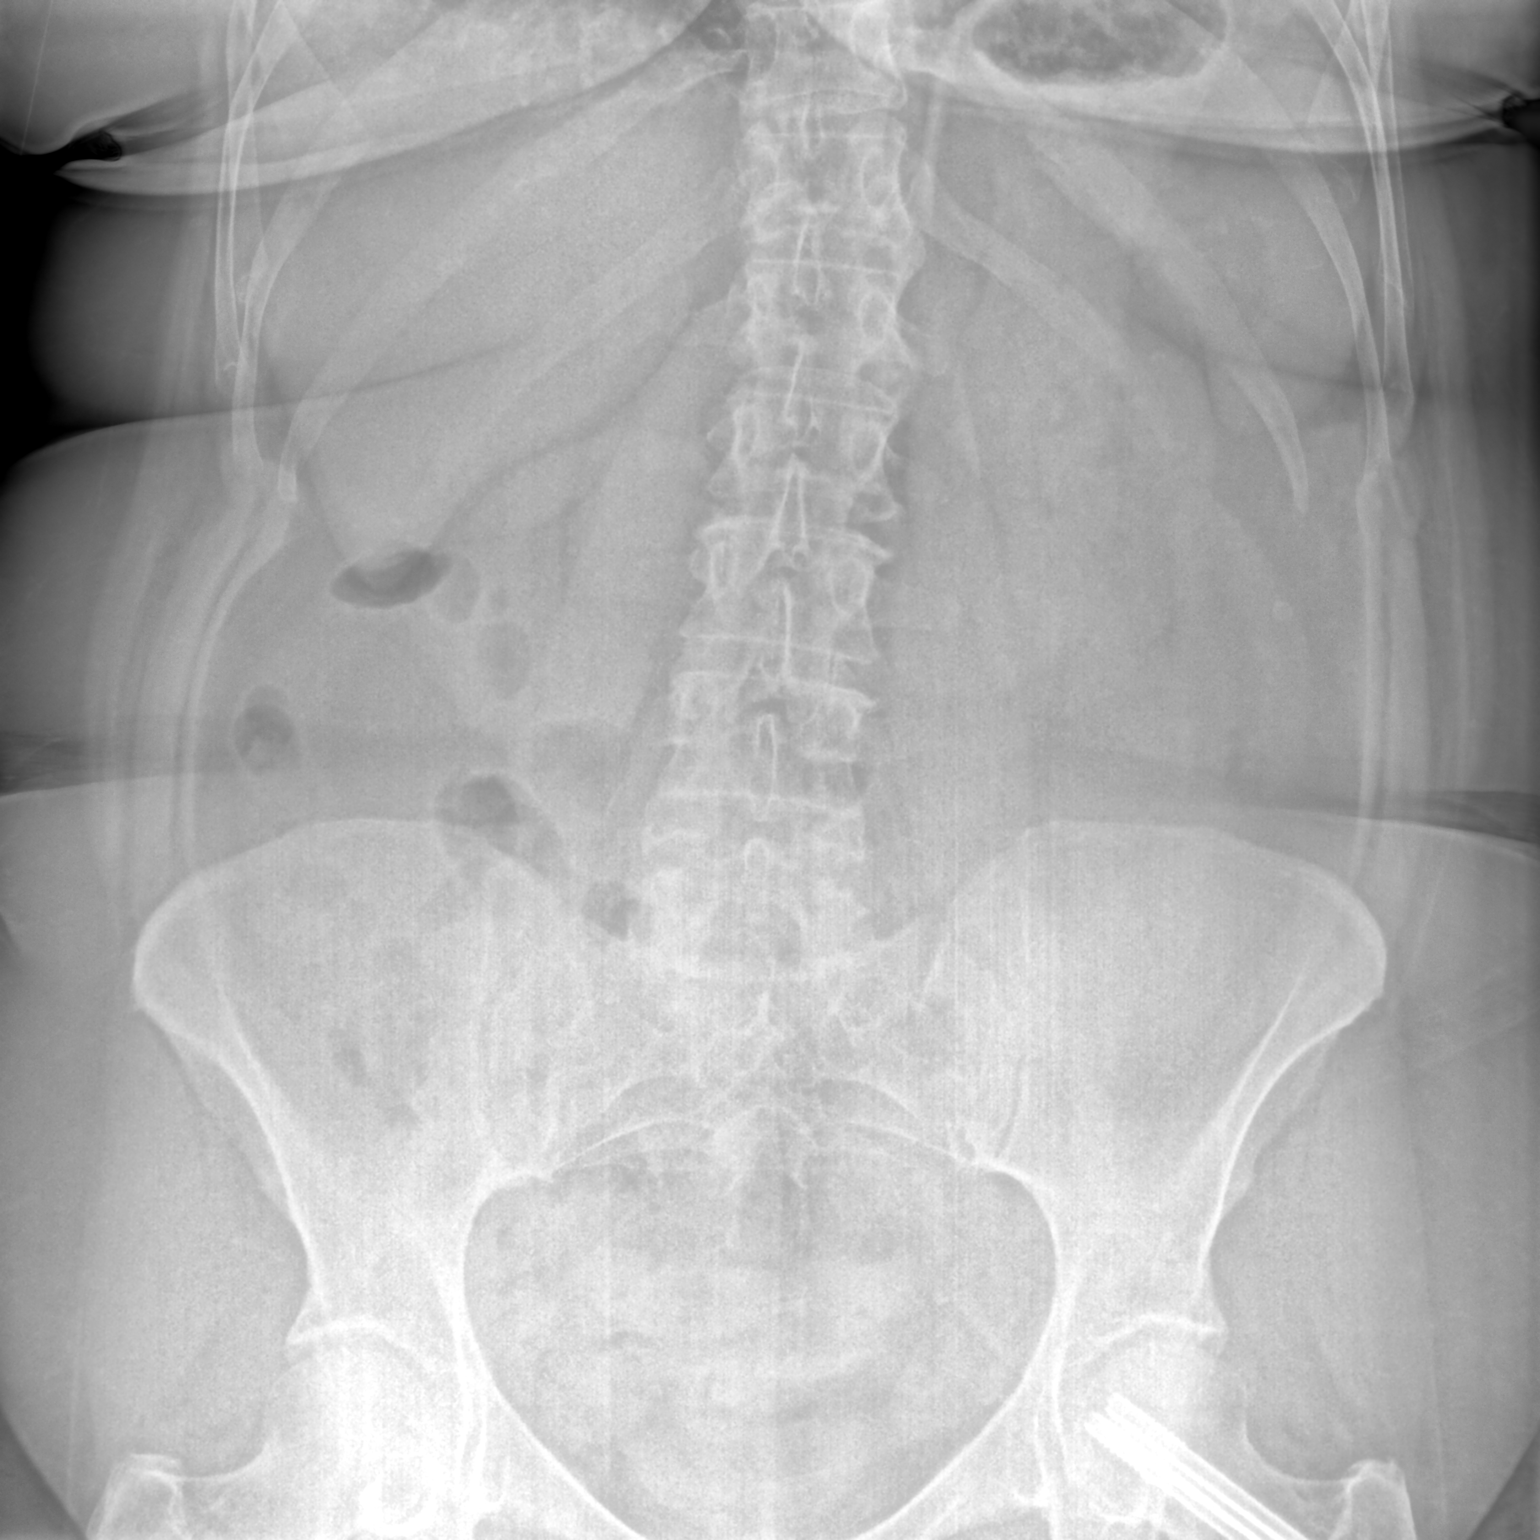

[abdomen standing ap (2 of 2)]
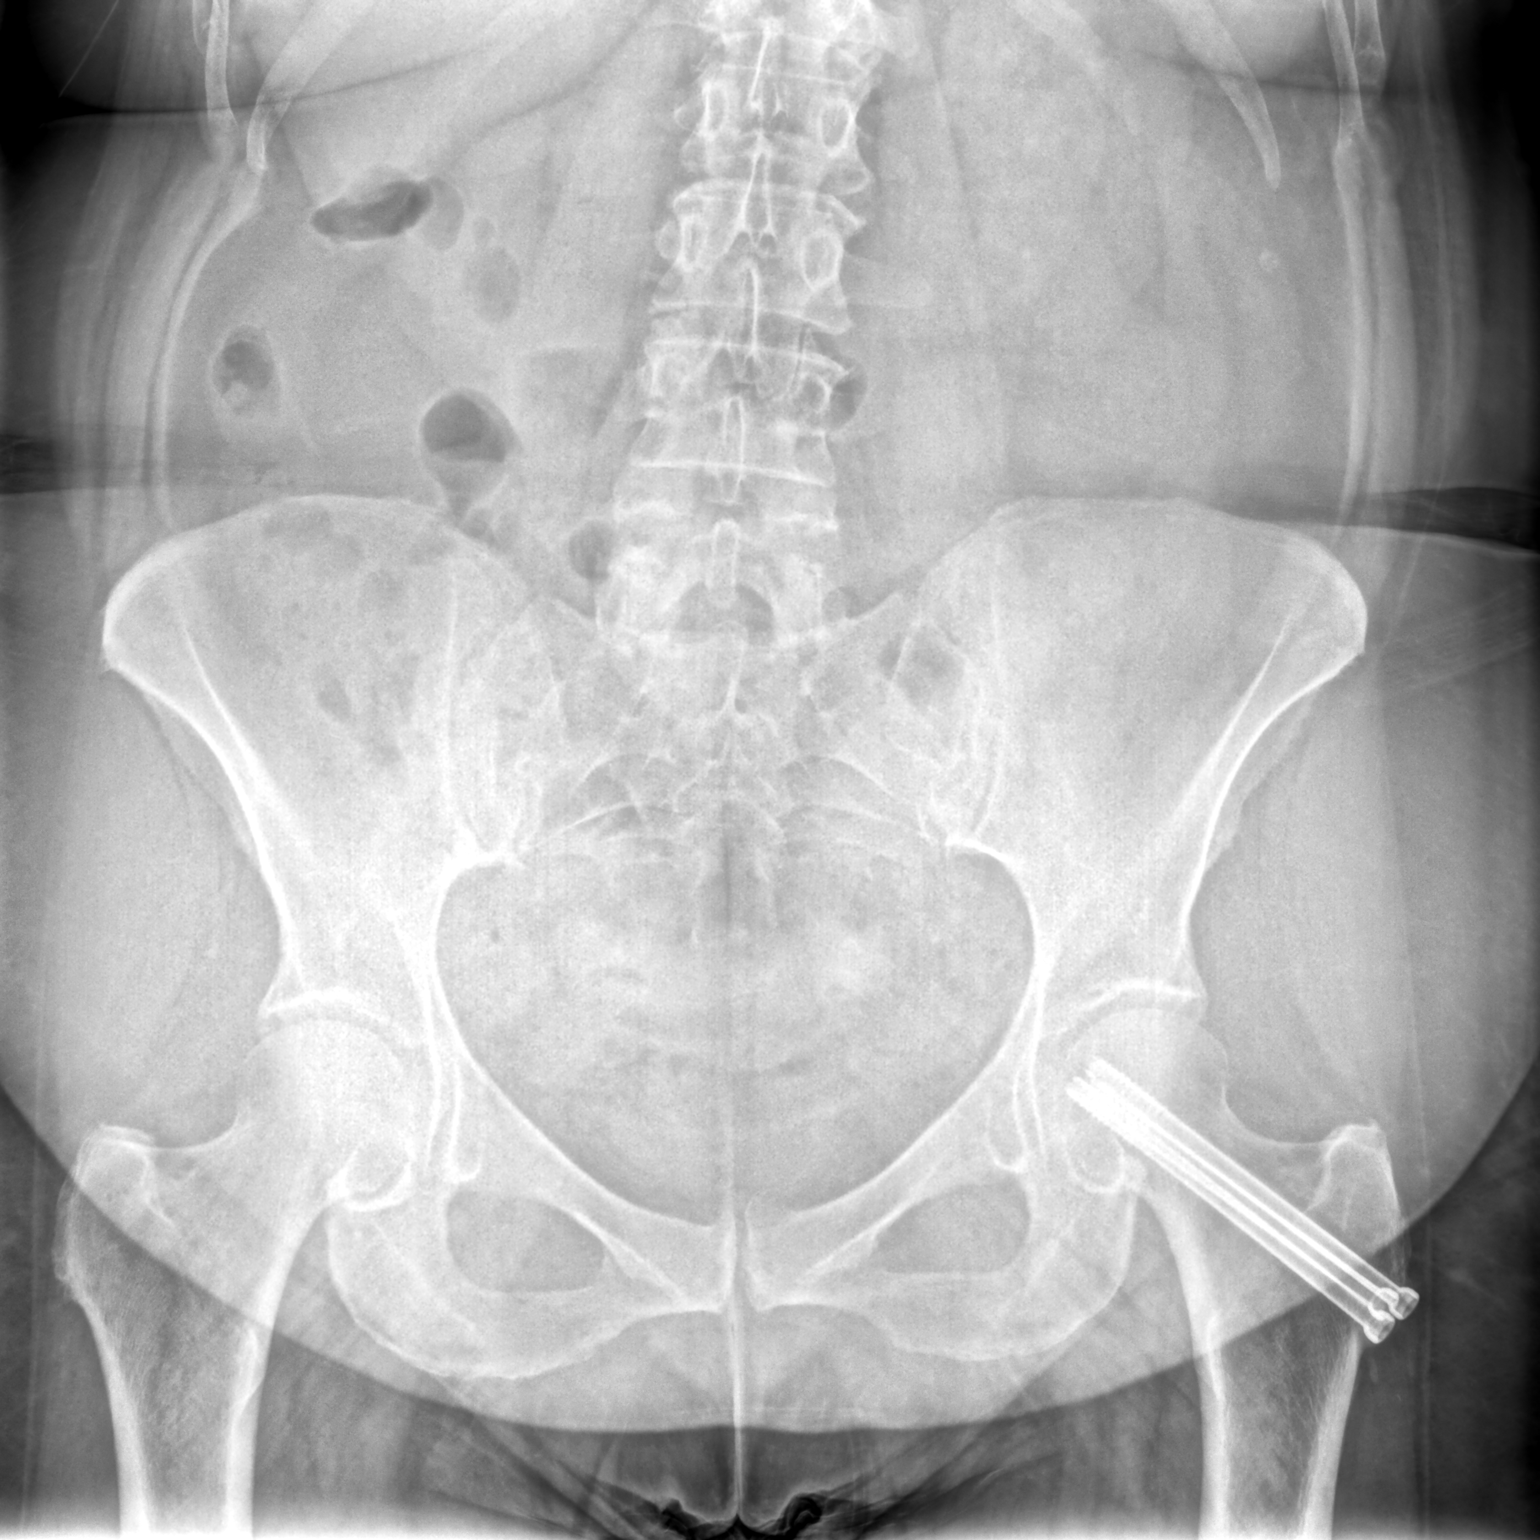

[2 of 2 positions shown; findings below may reference images not displayed]

FINDINGS: No definite calcifications are noted overlying the renal shadows or
expected course the ureters.

Small hyperdensities within colon/bowel noted.

No dilated bowel loops are present.

No acute bony abnormalities are noted.

Surgical hardware within proximal LEFT femur noted.
IMPRESSION: 1. No definite radiopaque urinary calculi identified. Recommend CT
if there is strong clinical suspicion for
nephrolithiasis/obstructing urinary calculi.
2. No evidence of bowel obstruction.

## 2023-12-31 DIAGNOSIS — R7301 Impaired fasting glucose: Secondary | ICD-10-CM | POA: Diagnosis not present

## 2023-12-31 DIAGNOSIS — M810 Age-related osteoporosis without current pathological fracture: Secondary | ICD-10-CM | POA: Diagnosis not present

## 2023-12-31 DIAGNOSIS — E785 Hyperlipidemia, unspecified: Secondary | ICD-10-CM | POA: Diagnosis not present

## 2024-01-04 DIAGNOSIS — E669 Obesity, unspecified: Secondary | ICD-10-CM | POA: Diagnosis not present

## 2024-01-04 DIAGNOSIS — E785 Hyperlipidemia, unspecified: Secondary | ICD-10-CM | POA: Diagnosis not present

## 2024-01-04 DIAGNOSIS — M810 Age-related osteoporosis without current pathological fracture: Secondary | ICD-10-CM | POA: Diagnosis not present

## 2024-01-04 DIAGNOSIS — Z23 Encounter for immunization: Secondary | ICD-10-CM | POA: Diagnosis not present

## 2024-01-04 DIAGNOSIS — R7301 Impaired fasting glucose: Secondary | ICD-10-CM | POA: Diagnosis not present

## 2024-06-30 DIAGNOSIS — E785 Hyperlipidemia, unspecified: Secondary | ICD-10-CM | POA: Diagnosis not present

## 2024-06-30 DIAGNOSIS — M810 Age-related osteoporosis without current pathological fracture: Secondary | ICD-10-CM | POA: Diagnosis not present

## 2024-06-30 DIAGNOSIS — R7301 Impaired fasting glucose: Secondary | ICD-10-CM | POA: Diagnosis not present

## 2024-07-04 DIAGNOSIS — F329 Major depressive disorder, single episode, unspecified: Secondary | ICD-10-CM | POA: Diagnosis not present

## 2024-07-04 DIAGNOSIS — Z23 Encounter for immunization: Secondary | ICD-10-CM | POA: Diagnosis not present

## 2024-07-04 DIAGNOSIS — F5105 Insomnia due to other mental disorder: Secondary | ICD-10-CM | POA: Diagnosis not present

## 2024-07-04 DIAGNOSIS — M810 Age-related osteoporosis without current pathological fracture: Secondary | ICD-10-CM | POA: Diagnosis not present

## 2024-07-04 DIAGNOSIS — E669 Obesity, unspecified: Secondary | ICD-10-CM | POA: Diagnosis not present

## 2024-07-04 DIAGNOSIS — E785 Hyperlipidemia, unspecified: Secondary | ICD-10-CM | POA: Diagnosis not present

## 2024-07-04 DIAGNOSIS — R7301 Impaired fasting glucose: Secondary | ICD-10-CM | POA: Diagnosis not present

## 2024-07-16 DIAGNOSIS — L738 Other specified follicular disorders: Secondary | ICD-10-CM | POA: Diagnosis not present

## 2024-07-16 DIAGNOSIS — L7211 Pilar cyst: Secondary | ICD-10-CM | POA: Diagnosis not present

## 2024-07-16 DIAGNOSIS — L728 Other follicular cysts of the skin and subcutaneous tissue: Secondary | ICD-10-CM | POA: Diagnosis not present
